# Patient Record
Sex: Female | Born: 1940 | Race: Black or African American | Hispanic: No | Smoking: Former smoker
Health system: Southern US, Community
[De-identification: ages and names within clinical notes are randomized; demographics above are authoritative.]

## PROBLEM LIST (undated history)

## (undated) DIAGNOSIS — I219 Acute myocardial infarction, unspecified: Secondary | ICD-10-CM

## (undated) DIAGNOSIS — G35 Multiple sclerosis: Secondary | ICD-10-CM

## (undated) DIAGNOSIS — E785 Hyperlipidemia, unspecified: Secondary | ICD-10-CM

## (undated) DIAGNOSIS — I509 Heart failure, unspecified: Secondary | ICD-10-CM

## (undated) DIAGNOSIS — E119 Type 2 diabetes mellitus without complications: Secondary | ICD-10-CM

## (undated) DIAGNOSIS — I10 Essential (primary) hypertension: Secondary | ICD-10-CM

## (undated) HISTORY — DX: Acute myocardial infarction, unspecified: I21.9

## (undated) HISTORY — PX: HYSTEROTOMY: SHX1776

## (undated) HISTORY — DX: Hyperlipidemia, unspecified: E78.5

## (undated) HISTORY — PX: HIP SURGERY: SHX245

## (undated) HISTORY — DX: Heart failure, unspecified: I50.9

## (undated) HISTORY — DX: Essential (primary) hypertension: I10

## (undated) HISTORY — DX: Multiple sclerosis: G35

## (undated) HISTORY — DX: Type 2 diabetes mellitus without complications: E11.9

---

## 2019-10-17 ENCOUNTER — Encounter: Payer: Self-pay | Admitting: Neurology

## 2019-11-06 ENCOUNTER — Ambulatory Visit: Payer: Self-pay | Admitting: Cardiology

## 2019-11-21 ENCOUNTER — Ambulatory Visit: Payer: Self-pay | Admitting: Cardiology

## 2019-11-28 ENCOUNTER — Other Ambulatory Visit: Payer: Self-pay

## 2019-11-28 ENCOUNTER — Encounter: Payer: Self-pay | Admitting: Cardiology

## 2019-11-28 ENCOUNTER — Ambulatory Visit: Payer: Medicare Other | Admitting: Cardiology

## 2019-11-28 VITALS — BP 135/73 | HR 87 | Resp 16 | Ht 60.0 in | Wt 95.0 lb

## 2019-11-28 DIAGNOSIS — I2510 Atherosclerotic heart disease of native coronary artery without angina pectoris: Secondary | ICD-10-CM | POA: Insufficient documentation

## 2019-11-28 DIAGNOSIS — I10 Essential (primary) hypertension: Secondary | ICD-10-CM

## 2019-11-28 DIAGNOSIS — Z87898 Personal history of other specified conditions: Secondary | ICD-10-CM

## 2019-11-28 DIAGNOSIS — I25118 Atherosclerotic heart disease of native coronary artery with other forms of angina pectoris: Secondary | ICD-10-CM

## 2019-11-28 NOTE — Progress Notes (Signed)
Patient referred by [name] for coronary artery disease with history of MI.   Subjective:   Joanne Hayden, female    DOB: [birth year], 79 y.o.   MRN: [id number]  Chief Complaint  Patient presents with  . Coronary Artery Disease  . Hypertension  . New Patient (Initial Visit)    HPI   Joanne Hayden is 79 y.o. AA female with hypertension, hyperlipidemia, type 2 diabetes mellitus, CAD, multiple sclerosis, primary open-angle glaucoma.  Patient moved to [place] in 2021 from [place] to be closer to family.  She currently lives with her son, who accompanies her at the visit today. She has history of MI in January 2020 treated with PCI. According to patient she has had an echocardiogram several months ago, but it is unclear if she has had a stress test in the past. She currently reports constant non-radiating mild chest pain, not associated with activity. Denies associated symptoms.   Patient reports occasional episodes of syncope. Her last episode was in April 2021 and occurred while walking. Denies associated chest pain, shortness of breath, dizziness. Reports she does not recall the event well.   Of note patient is currently taking Eliquis, although it is not clear why.  Patient has diagnosis of multiple sclerosis since 1985. She reports chronic generalized weakness and fatigue as a result of her MS. She ambulates using a walker at home, prefers wheelchair when outside.    Past Medical History:  Diagnosis Date  . CHF (congestive heart failure) ([place])   . Diabetes mellitus without complication ([place])   . Heart attack ([place])   . Hyperlipidemia   . Hypertension     Past Surgical History:  Procedure Laterality Date  . HIP SURGERY    . HYSTEROTOMY      Social History   Tobacco Use  Smoking Status Former Smoker  . Packs/day: 0.25  . Years: 0.50  . Pack years: 0.12  . Types: Cigarettes  Smokeless Tobacco Never Used    Social History   Substance and  Sexual Activity  Alcohol Use Yes   Comment: occ    Family History  Problem Relation Age of Onset  . Hypertension Sister     Current Outpatient Medications on File Prior to Visit  Medication Sig Dispense Refill  . aspirin EC 81 MG tablet Take 81 mg by mouth daily. Swallow whole.    Marland Kitchen atorvastatin (LIPITOR) 40 MG tablet Take 40 mg by mouth at bedtime.    . dorzolamide-timolol (COSOPT) 22.3-6.8 MG/ML ophthalmic solution 1 drop 2 (two) times daily.    Marland Kitchen ELIQUIS 2.5 MG TABS tablet Take 2.5 mg by mouth 2 (two) times daily.    Marland Kitchen latanoprost (XALATAN) 0.005 % ophthalmic solution 1 drop at bedtime.    Marland Kitchen lisinopril (ZESTRIL) 10 MG tablet Take 10 mg by mouth daily.     No current facility-administered medications on file prior to visit.    Cardiovascular and other pertinent studies:  EKG 11/28/2019: Sinus rhythm 86 bpm Possible old posterior infarct Occasional PAC    Left atrial enlargement.   Recent labs: 10/15/2019: Glucose 93, BUN/Cr 13/0.65. EGFR 88. Na/K 141/4.8. Rest of the CMP normal H/H 13/39. MCV 91. Platelets 147 Chol 96, TG 32, HDL 51, LDL 36 No TSH or A1C available.   Review of Systems  Cardiovascular: Positive for chest pain and leg swelling (bilateral). Negative for claudication, dyspnea on exertion, orthopnea, palpitations and syncope.  Respiratory: Negative for shortness of breath.   Musculoskeletal: Positive for  muscle weakness (Chronic).  Gastrointestinal: Negative for melena.  Neurological: Positive for weakness (Chronic). Negative for dizziness, light-headedness and numbness.        Vitals:   11/28/19 1120  BP: 135/73  Pulse: 87  Resp: 16  SpO2: 95%    Body mass index is 18.55 kg/m. Filed Weights   11/28/19 1120  Weight: 95 lb (43.1 kg)     Objective:   Physical Exam Constitutional:      Appearance: She is normal weight.     Comments: In wheelchair   Cardiovascular:     Rate and Rhythm: Normal rate and regular rhythm.     Pulses:           Radial pulses are 2+ on the right side and 2+ on the left side.       Dorsalis pedis pulses are 1+ on the right side and 1+ on the left side.       Posterior tibial pulses are 1+ on the right side and 1+ on the left side.     Heart sounds: Murmur heard.  Systolic murmur is present with a grade of 3/6 at the upper right sternal border.      Comments: Trace leg edema bilaterally.  Pulmonary:     Effort: Pulmonary effort is normal.     Breath sounds: Normal breath sounds.  Neurological:     Mental Status: She is alert.     Comments: Gait and balance limitations due to MS          Assessment & Recommendations:   79 y.o. African American female with hypertension, hyperlipidemia, type 2 diabetes mellitus, CAD, multiple sclerosis, primary open-angle glaucoma.  Essential Hypertension:  Patient's blood pressure well controlled in the office today. Will continue 30m lisinopril daily. Continue to follow.   Coronary Artery Disease:   In light of patient's history of coronary artery disease and atypical chest pain, will order echocardiogram and nuclear stress test to evaluate atypical chest pain symptoms and investigate CAD. Continue aspirin, atorvastatin, and lisinopril.   [place] to further delineate patient's history and appropriate management. These records will also hopefully explain why patient is currently on Eliquis. No changes made to medications at today's visit.   History of Syncope:  Low suspicion for cardiac etiology of syncope and as it has not occurred recently will not order monitor at this time. However, if she has recurrent episode with consider monitor to evaluate for underlying arrhythmias which could be contributing.  Will follow up in 4-6 weeks to reevaluate symptoms and review results of testing.     Thank you for referring the [place] [facility] Please feel free to contact with any  questions.   MNigel Mormon MD Pager: [phone]: [phone]

## 2019-12-17 ENCOUNTER — Other Ambulatory Visit: Payer: Self-pay

## 2019-12-17 MED ORDER — LISINOPRIL 10 MG PO TABS
10.0000 mg | ORAL_TABLET | Freq: Every day | ORAL | 1 refills | Status: DC
Start: 2019-12-17 — End: 2021-09-01

## 2019-12-20 ENCOUNTER — Ambulatory Visit: Payer: Medicare Other

## 2019-12-20 ENCOUNTER — Other Ambulatory Visit: Payer: Self-pay

## 2019-12-20 DIAGNOSIS — I25118 Atherosclerotic heart disease of native coronary artery with other forms of angina pectoris: Secondary | ICD-10-CM

## 2019-12-26 ENCOUNTER — Other Ambulatory Visit: Payer: Medicare Other

## 2020-01-04 ENCOUNTER — Ambulatory Visit: Payer: Medicare Other | Admitting: Cardiology

## 2020-01-09 ENCOUNTER — Other Ambulatory Visit: Payer: Self-pay

## 2020-01-09 ENCOUNTER — Ambulatory Visit: Payer: Medicare Other

## 2020-01-09 DIAGNOSIS — I25118 Atherosclerotic heart disease of native coronary artery with other forms of angina pectoris: Secondary | ICD-10-CM

## 2020-01-14 NOTE — Progress Notes (Signed)
NEUROLOGY CONSULTATION NOTE  Joanne Hayden MRN: [id number] DOB: [birth year]  Referring provider: Lorenda Ishihara, MD Primary care provider: Lorenda Ishihara, MD   Reason for consult:  Multiple sclerosis  HISTORY OF PRESENT ILLNESS: Joanne Hayden is a 79 year old right-handed female with CHF, CAD, diabetes mellitus, HTN and HLD who presents for multiple sclerosis.  History is supplemented by referring provider's note.  Patient moved to [place] from [place] this year to live closer to family.  She presents to establish care for multiple sclerosis.    She was diagnosed with multiple sclerosis in 1985.  Around 1980 she had numbness in right toe that traveled up to her right knee lasting 2-3 weeks. She had a recurrent episode about a year later but the numbness traveled up her thigh.  It occurred a third time and she was referred to neurology when she had an MRI of the brain that revealed an MS plaque on the right cerebral hemisphere.  She also had abnormal CSF analysis.  She was started on Avonex for 30 years until 2021.  She stopped because she had an MI in 2020.  While on Avonex, she continued to have episodes of numbness and tingling.  Gradually she developed increased weakness in the legs and difficulty ambulating.     Current DMT:  None Past DMT:  Avonex (took for 30 years, stopped April 2021)  Current medications:  Eliquis, lisinopril, atorvastatin 40mg   Vision:  S/p cataract surgery.  Uses reading glasses. Motor:  Generalized weakness.  Sometimes has spasms/jerking of the left foot if touched. Sensory:  She may have mild numbness and tingling in the lower extremities, mostly left side. Pain:  No  Gait:  Ambulates with walker at home.  Uses a wheelchair outside. Bowel/Bladder:  No issues Fatigue:  Chronic Cognition:  Memory okay Mood:  ok She reports episodes of losing consciousness.  No preceding headache, lightheadedness/dizziness, diaphoresis, chest pain,  shortness of breath, or palpitations.   No warning.  Never witnessed.  She is unconscious for brief period.  The first time it occurred, it was after an Avonex injection.  She started having full body shaking and lowered herself to the floor.  She doesn't remember if she had passed out but she realized she had urinated but no tongue biting.  She had two subsequent episodes not preceded by shaking.  The last episode occurred after her Moderna COVID shot in April 2021.  When she woke up on the floor, she did not have incontinence or tongue biting.  July 2021 LABS:  CBC with WBC 4.6, HGB 13.1, HCT 39.3, PLT 147; CMP with Na 141, K 4.8, Cl 104, CO2 30, glucose 93, BUN 13, Cr 0.65, t bili 0.4, ALP 93, AST 21, ALT 20  PAST MEDICAL HISTORY: Past Medical History:  Diagnosis Date  . CHF (congestive heart failure) ([facility])   . Diabetes mellitus without complication ([facility])   . Heart attack ([facility])   . Hyperlipidemia   . Hypertension     PAST SURGICAL HISTORY: Past Surgical History:  Procedure Laterality Date  . HIP SURGERY    . HYSTEROTOMY      MEDICATIONS: Current Outpatient Medications on File Prior to Visit  Medication Sig Dispense Refill  . aspirin EC 81 MG tablet Take 81 mg by mouth daily. Swallow whole.    August 2021 atorvastatin (LIPITOR) 40 MG tablet Take 40 mg by mouth at bedtime.    . dorzolamide-timolol (COSOPT) 22.3-6.8 MG/ML ophthalmic solution 1 drop 2 (two) times  daily.    Marland Kitchen ELIQUIS 2.5 MG TABS tablet Take 2.5 mg by mouth 2 (two) times daily.    Marland Kitchen latanoprost (XALATAN) 0.005 % ophthalmic solution 1 drop at bedtime.    Marland Kitchen lisinopril (ZESTRIL) 10 MG tablet Take 1 tablet (10 mg total) by mouth daily. 90 tablet 1   No current facility-administered medications on file prior to visit.    ALLERGIES: Not on File  FAMILY HISTORY: Family History  Problem Relation Age of Onset  . Hypertension Sister     SOCIAL HISTORY: Social History   Socioeconomic History  . Marital status: Widowed     Spouse name: Not on file  . Number of children: 2  . Years of education: Not on file  . Highest education level: Not on file  Occupational History  . Not on file  Tobacco Use  . Smoking status: Former Smoker    Packs/day: 0.25    Years: 0.50    Pack years: 0.12    Types: Cigarettes  . Smokeless tobacco: Never Used  Vaping Use  . Vaping Use: Never used  Substance and Sexual Activity  . Alcohol use: Yes    Comment: occ  . Drug use: Never  . Sexual activity: Not on file  Other Topics Concern  . Not on file  Social History Narrative  . Not on file   Social Determinants of Health   Financial Resource Strain:   . Difficulty of Paying Living Expenses: Not on file  Food Insecurity:   . Worried About Programme researcher, broadcasting/film/video in the Last Year: Not on file  . Ran Out of Food in the Last Year: Not on file  Transportation Needs:   . Lack of Transportation (Medical): Not on file  . Lack of Transportation (Non-Medical): Not on file  Physical Activity:   . Days of Exercise per Week: Not on file  . Minutes of Exercise per Session: Not on file  Stress:   . Feeling of Stress : Not on file  Social Connections:   . Frequency of Communication with Friends and Family: Not on file  . Frequency of Social Gatherings with Friends and Family: Not on file  . Attends Religious Services: Not on file  . Active Member of Clubs or Organizations: Not on file  . Attends Banker Meetings: Not on file  . Marital Status: Not on file  Intimate Partner Violence:   . Fear of Current or Ex-Partner: Not on file  . Emotionally Abused: Not on file  . Physically Abused: Not on file  . Sexually Abused: Not on file    PHYSICAL EXAM: Blood pressure (!) 153/81, pulse 64, height 5\' 2"  (1.575 m), SpO2 96 %. General: No acute distress.  Patient appears well-groomed.  Head:  Normocephalic/atraumatic Eyes:  fundi examined but not visualized Neck: supple, no paraspinal tenderness, full range of  motion Back: No paraspinal tenderness Heart: regular rate and rhythm Lungs: Clear to auscultation bilaterally. Vascular: No carotid bruits. Neurological Exam: Mental status: alert and oriented to person, place, and time, recent and remote memory intact, fund of knowledge intact, attention and concentration intact, speech fluent and not dysarthric, language intact. Cranial nerves: CN I: not tested CN II: pupils equal, round and reactive to light, visual fields intact CN III, IV, VI:  full range of motion, no nystagmus, no ptosis CN V: facial sensation intact CN VII: upper and lower face symmetric CN VIII: hearing intact CN IX, X: gag intact, uvula midline CN XI:  sternocleidomastoid and trapezius muscles intact CN XII: tongue midline Bulk & Tone: increased tone in lower greater than upper extremities, no fasciculations. Motor:  5-/5 bilateral upper extremities, 2/5 bilateral hip flexion, 3+/5 right knee extension/flexion, right foot drop, otherwise 5/5. Sensation:  Pinprick sensation intact and vibratory sensation slightly reduced in feet.. Deep Tendon Reflexes:  3+ throughout, toes downgoing.  Finger to nose testing:  Without dysmetria.   Gait:  Deferred.  In wheelchair.  IMPRESSION: 1.  Multiple sclerosis.  I think it is reasonable to remain off of Avonex.  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  Also, her MS may have "burnt out" at this time.  If we should ever need to revisit starting another DMT, there are other options for active SPMS. 2.  Recurrent syncope.  Would like to evaluate for possible seizure. PLAN: 1.  Will check routine EEG to evaluate for epileptiform discharges. 2.  Check vitamin D level 3.  Further recommendations pending results.  Otherwise, follow up in 6 months.  Thank you for allowing me to take part in the care of this patient.  Shon Millet, DO  CC: Lorenda Ishihara, MD

## 2020-01-15 ENCOUNTER — Ambulatory Visit: Payer: Medicare Other | Admitting: Neurology

## 2020-01-15 ENCOUNTER — Other Ambulatory Visit: Payer: Self-pay

## 2020-01-15 ENCOUNTER — Encounter: Payer: Self-pay | Admitting: Neurology

## 2020-01-15 VITALS — BP 153/81 | HR 64 | Ht 62.0 in

## 2020-01-15 DIAGNOSIS — Z87898 Personal history of other specified conditions: Secondary | ICD-10-CM

## 2020-01-15 DIAGNOSIS — G35 Multiple sclerosis: Secondary | ICD-10-CM | POA: Diagnosis not present

## 2020-01-15 NOTE — Patient Instructions (Addendum)
I think it is reasonable to remain off of Avonex.  It is likely not effective anymore.  The MS likely has "burnt out" anyway.    I would like to check a vitamin D level I would also like to check an EEG to record brainwaves Further recommendations pending results. Otherwise, follow up in 6 months.    Your provider has requested that you have labwork completed today. Please go to [facility] ([id number]) on the second floor of this building before leaving the office today. You do not need to check in. If you are not called within 15 minutes please check with the front desk.

## 2020-01-16 NOTE — Progress Notes (Signed)
[employer] Forms filled out.

## 2020-01-18 ENCOUNTER — Telehealth: Payer: Self-pay | Admitting: Neurology

## 2020-01-18 NOTE — Telephone Encounter (Signed)
Patient let message with acessnurse requesting to speak with Dr Moises Blood assistant regarding paper work. Please call

## 2020-01-18 NOTE — Telephone Encounter (Signed)
LMOVM, Letter ready for pt to pick up. At the front desk

## 2020-01-22 NOTE — Progress Notes (Signed)
Patient referred by [name] for coronary artery disease with history of MI.   Subjective:   Joanne Hayden, female    DOB: 05/10/1940, 79 y.o.   MRN: [id number]   Chief Complaint  Patient presents with  . Coronary Artery Disease  . Follow-up    HPI   Joanne Hayden is 79 y.o. AA female with hypertension, hyperlipidemia, type 2 diabetes mellitus, CAD, multiple sclerosis, primary open-angle glaucoma.  Patient has not had any new symptoms since last visit. Echocardiogram results reviewed with the patient, details below.   Patient does not have h/o smoking, h/o lung disease. She does have limited physical mobility due to to her MS. She has noted swelling in both her legs, R>L. She has had one episode of syncope, which was previously attributed to low blood glucose.   Initial consultation HPI 11/28/2019: Patient moved to [facility] in 2021 from [place] to be closer to family.  She currently lives with her son, who accompanies her at the visit today. She has history of MI in January 2020 treated with PCI. According to patient she has had an echocardiogram several months ago, but it is unclear if she has had a stress test in the past. She currently reports constant non-radiating mild chest pain, not associated with activity. Denies associated symptoms.   Patient reports occasional episodes of syncope. Her last episode was in April 2021 and occurred while walking. Denies associated chest pain, shortness of breath, dizziness. Reports she does not recall the event well.   Of note patient is currently taking Eliquis, although it is not clear why.  Patient has diagnosis of multiple sclerosis since 1985. She reports chronic generalized weakness and fatigue as a result of her MS. She ambulates using a walker at home, prefers wheelchair when outside.     Current Outpatient Medications on File Prior to Visit  Medication Sig Dispense Refill  . acetaminophen (TYLENOL) 325 MG  tablet Take 650 mg by mouth every 6 (six) hours as needed.    Marland Kitchen aspirin EC 81 MG tablet Take 81 mg by mouth daily. Swallow whole.    Marland Kitchen atorvastatin (LIPITOR) 40 MG tablet Take 40 mg by mouth at bedtime.    . dorzolamide-timolol (COSOPT) 22.3-6.8 MG/ML ophthalmic solution 1 drop 2 (two) times daily.    Marland Kitchen ELIQUIS 2.5 MG TABS tablet Take 2.5 mg by mouth 2 (two) times daily.    Marland Kitchen latanoprost (XALATAN) 0.005 % ophthalmic solution 1 drop at bedtime.    Marland Kitchen lisinopril (ZESTRIL) 10 MG tablet Take 1 tablet (10 mg total) by mouth daily. 90 tablet 1   No current facility-administered medications on file prior to visit.    Cardiovascular and other pertinent studies:  Lexiscan Tetrofosmin Stress Test  01/09/2020: Nondiagnostic ECG stress. Resting EKG/ECG demonstrated normal sinus rhythm. Observed anteroseptal infarct. No ST-T wave abnormalities.  Peak EKG/ECG revealed no ST-T wave abnormalities. Rare PACs and PVCs.  Myocardial perfusion is normal. Although the TID ratio 1.47 is abnormal, visually I do not agree with the findings and LV appears to be normal/small sized in both rest and stress images.  LV end-diastolic volume was 50 mL. Overall LV systolic function is normal without regional wall motion abnormalities. Stress LV EF: 61%.  No previous exam available for comparison. Low risk.   Echocardiogram 12/20/2019:  Left ventricle cavity is normal in size. Moderate concentric hypertrophy  of the left ventricle. Normal global wall motion. Normal LV systolic  function with EF 55%. Doppler evidence of  grade I (impaired) diastolic  dysfunction, normal LAP.  Left atrial cavity is slightly dilated. Aneurysmal interatrial septum  without 2D or color Doppler evidence of interatrial shunt.  Right atrial cavity is moderately dilated.  Trileaflet aortic valve. Moderate (Grade II) aortic regurgitation.  Moderate (Grade II) mitral regurgitation.  Severe tricuspid regurgitation. Estimated pulmonary artery systolic    pressure 25 mmHg.  EKG 11/28/2019: Sinus rhythm 86 bpm Possible old posterior infarct Occasional PAC    Left atrial enlargement.   Recent labs: 10/15/2019: Glucose 93, BUN/Cr 13/0.65. EGFR 88. Na/K 141/4.8. Rest of the CMP normal H/H 13/39. MCV 91. Platelets 147 Chol 96, TG 32, HDL 51, LDL 36 No TSH or A1C available.   Review of Systems  Cardiovascular: Positive for chest pain and leg swelling (bilateral). Negative for claudication, dyspnea on exertion, orthopnea, palpitations and syncope.  Respiratory: Negative for shortness of breath.   Musculoskeletal: Positive for muscle weakness (Chronic).  Gastrointestinal: Negative for melena.  Neurological: Positive for weakness (Chronic). Negative for dizziness, light-headedness and numbness.        Vitals:   01/23/20 1348  BP: (!) 149/77  Pulse: 83  Resp: 16  SpO2: 94%     Body mass index is 17.3 kg/m. Filed Weights   01/23/20 1348  Weight: 94 lb 9.6 oz (42.9 kg)     Objective:   Physical Exam Constitutional:      Appearance: She is normal weight.     Comments: In wheelchair   Cardiovascular:     Rate and Rhythm: Normal rate and regular rhythm.     Pulses:          Radial pulses are 2+ on the right side and 2+ on the left side.       Dorsalis pedis pulses are 1+ on the right side and 1+ on the left side.       Posterior tibial pulses are 1+ on the right side and 1+ on the left side.     Heart sounds: Murmur heard.  Systolic murmur is present with a grade of 3/6 at the upper right sternal border.      Comments: Trace leg edema bilaterally.  Pulmonary:     Effort: Pulmonary effort is normal.     Breath sounds: Normal breath sounds.  Neurological:     Mental Status: She is alert.     Comments: Gait and balance limitations due to MS          Assessment & Recommendations:   Lorrene Graef is 79 y.o. AA female with hypertension, hyperlipidemia, type 2 diabetes mellitus, CAD, multiple sclerosis, primary  open-angle glaucoma, mod-severe tricuspid regurgitation, h/o syncope.  Tricuspid regurgitation: Given her limited mobility due to MS< leg swelling, h/o syncope, and mod=seere tricuspid regurgitation; DVT/PE is in the differential. Recommend LE venous duplex, chest Xray, VQ scan.  Essential Hypertension:  Patient's blood pressure well controlled in the office today. Will continue 76m lisinopril daily. Continue to follow.   Coronary Artery Disease:   No angina. No ischemia on stress test. Continue aspirin, atorvastatin, and lisinopril.    F/u after above tests   MNigel Mormon MD Pager: [phone]: [phone]

## 2020-01-23 ENCOUNTER — Other Ambulatory Visit: Payer: Self-pay

## 2020-01-23 ENCOUNTER — Ambulatory Visit: Payer: Medicare Other | Admitting: Cardiology

## 2020-01-23 ENCOUNTER — Encounter: Payer: Self-pay | Admitting: Cardiology

## 2020-01-23 VITALS — BP 149/77 | HR 83 | Resp 16 | Ht 62.0 in | Wt 94.6 lb

## 2020-01-23 DIAGNOSIS — I361 Nonrheumatic tricuspid (valve) insufficiency: Secondary | ICD-10-CM

## 2020-01-23 DIAGNOSIS — I25118 Atherosclerotic heart disease of native coronary artery with other forms of angina pectoris: Secondary | ICD-10-CM

## 2020-01-23 DIAGNOSIS — I10 Essential (primary) hypertension: Secondary | ICD-10-CM

## 2020-01-23 DIAGNOSIS — Z87898 Personal history of other specified conditions: Secondary | ICD-10-CM

## 2020-01-23 DIAGNOSIS — I2510 Atherosclerotic heart disease of native coronary artery without angina pectoris: Secondary | ICD-10-CM

## 2020-01-23 DIAGNOSIS — M7989 Other specified soft tissue disorders: Secondary | ICD-10-CM

## 2020-01-23 NOTE — Patient Instructions (Signed)
Echocardiogram showed that your tricuspid (right-sided) valve is moderate to severely leaky.  Right side of your heart is mildly dilated.  Given your limited physical activity, you are at increased risk of clot formation in your leg veins, that could travel to your lung arteries and cause pulmonary embolism.  This could potentially be tied to an episode of loss of consciousness or syncope, as well as leakiness of your tricuspid valve and increase in size of the right-sided heart chambers.  I recommend doing the following tests:  Ultrasound of your leg veins to rule out lung vein clot or deep vein thrombosis. Chest x-ray VQ scan to rule out lung artery clot or pulmonary embolism

## 2020-01-24 ENCOUNTER — Ambulatory Visit (INDEPENDENT_AMBULATORY_CARE_PROVIDER_SITE_OTHER): Payer: Medicare Other | Admitting: Neurology

## 2020-01-24 ENCOUNTER — Other Ambulatory Visit (INDEPENDENT_AMBULATORY_CARE_PROVIDER_SITE_OTHER): Payer: Medicare Other

## 2020-01-24 DIAGNOSIS — Z87898 Personal history of other specified conditions: Secondary | ICD-10-CM

## 2020-01-24 DIAGNOSIS — G35 Multiple sclerosis: Secondary | ICD-10-CM

## 2020-01-24 LAB — VITAMIN D 25 HYDROXY (VIT D DEFICIENCY, FRACTURES): VITD: 25.72 ng/mL — ABNORMAL LOW (ref 30.00–100.00)

## 2020-01-25 ENCOUNTER — Telehealth: Payer: Self-pay | Admitting: Neurology

## 2020-01-25 NOTE — Telephone Encounter (Signed)
-----   Message from Drema Dallas, DO sent at 01/25/2020  7:44 AM EDT ----- Vit d low.  Recommend starting OTC D3 4000 IU daily.  Repeat a week prior to follow up

## 2020-01-25 NOTE — Telephone Encounter (Signed)
Patient called in stating she was just on the phone with someone, but she could not hear them. Possible lab results.

## 2020-01-25 NOTE — Procedures (Signed)
ELECTROENCEPHALOGRAM REPORT  Date of Study: 01/24/2020  Patient's Name: Joanne Hayden MRN: [id number] Date of Birth: May 08, 1940  Referring Provider:  Lorenda Ishihara, MD  Clinical History: 79 year old female with multiple sclerosis presents with recurrent syncope  Medications: Aspirin Atorvastatin Eliquis Lisinopril  Technical Summary: A multichannel digital EEG recording measured by the international 10-20 system with electrodes applied with paste and impedances below 5000 ohms performed in our laboratory with EKG monitoring in an awake and drowsy patient.  Hyperventilation was not performed as patient wearing a face mask due to the Covid-19 pandemic.  Photic stimulation was performed.  The digital EEG was referentially recorded, reformatted, and digitally filtered in a variety of bipolar and referential montages for optimal display.    Description: The patient is awake and drowsy during the recording.  During maximal wakefulness, there is a symmetric, medium voltage 9 Hz posterior dominant rhythm that attenuates with eye opening.  The record is symmetric.  Stage 2 sleep was not seen.  Photic stimulation did not elicit any abnormalities.  There were no epileptiform discharges or electrographic seizures seen.    EKG lead was unremarkable.  Impression: This awake and drowsy EEG is normal.    Clinical Correlation: A normal EEG does not exclude a clinical diagnosis of epilepsy.  If further clinical questions remain, prolonged EEG may be helpful.  Clinical correlation is advised.   Shon Millet, DO

## 2020-01-25 NOTE — Telephone Encounter (Signed)
Called patient and left message for a call back.  

## 2020-02-12 ENCOUNTER — Ambulatory Visit (HOSPITAL_COMMUNITY)
Admission: RE | Admit: 2020-02-12 | Discharge: 2020-02-12 | Disposition: A | Discharge status: Home / Self Care | Payer: Medicare Other | Source: Ambulatory Visit | Attending: Cardiology | Admitting: Cardiology

## 2020-02-12 ENCOUNTER — Other Ambulatory Visit: Payer: Self-pay | Admitting: Cardiology

## 2020-02-12 ENCOUNTER — Other Ambulatory Visit: Payer: Self-pay

## 2020-02-12 DIAGNOSIS — Z87898 Personal history of other specified conditions: Secondary | ICD-10-CM | POA: Insufficient documentation

## 2020-02-12 DIAGNOSIS — I361 Nonrheumatic tricuspid (valve) insufficiency: Secondary | ICD-10-CM

## 2020-02-12 MED ORDER — TECHNETIUM TO 99M ALBUMIN AGGREGATED
4.2000 | Freq: Once | INTRAVENOUS | Status: AC | PRN
  Administered 2020-02-12: 4.2 via INTRAVENOUS

## 2020-02-15 ENCOUNTER — Other Ambulatory Visit: Payer: Self-pay

## 2020-02-15 ENCOUNTER — Ambulatory Visit: Payer: Medicare Other

## 2020-02-15 DIAGNOSIS — M7989 Other specified soft tissue disorders: Secondary | ICD-10-CM

## 2020-02-21 ENCOUNTER — Encounter: Payer: Self-pay | Admitting: Podiatry

## 2020-02-21 ENCOUNTER — Ambulatory Visit: Payer: Medicare Other | Admitting: Podiatry

## 2020-02-21 ENCOUNTER — Other Ambulatory Visit: Payer: Self-pay

## 2020-02-21 DIAGNOSIS — M79675 Pain in left toe(s): Secondary | ICD-10-CM | POA: Diagnosis not present

## 2020-02-21 DIAGNOSIS — L84 Corns and callosities: Secondary | ICD-10-CM

## 2020-02-21 DIAGNOSIS — B351 Tinea unguium: Secondary | ICD-10-CM

## 2020-02-21 DIAGNOSIS — M79674 Pain in right toe(s): Secondary | ICD-10-CM | POA: Diagnosis not present

## 2020-02-21 DIAGNOSIS — M2041 Other hammer toe(s) (acquired), right foot: Secondary | ICD-10-CM

## 2020-02-21 DIAGNOSIS — D689 Coagulation defect, unspecified: Secondary | ICD-10-CM | POA: Diagnosis not present

## 2020-02-21 DIAGNOSIS — M2042 Other hammer toe(s) (acquired), left foot: Secondary | ICD-10-CM

## 2020-02-21 NOTE — Progress Notes (Signed)
Subjective:   Patient ID: Joanne Hayden, female   DOB: 78 y.o.   MRN: [id number]   HPI Patient presents in poor health with nail disease about both feet that is very painful and lesions bilateral on the third and fifth digits that are painful when pressed.  Patient states this is been ongoing and giving her problems and she cannot take care of this herself.  Also has digital deformities structural bunion deformity patient does not smoke and likes to be active if possible   Review of Systems  All other systems reviewed and are negative.       Objective:  Physical Exam Vitals and nursing note reviewed.  Constitutional:      Appearance: She is well-developed.  Pulmonary:     Effort: Pulmonary effort is normal.  Musculoskeletal:        General: Normal range of motion.  Skin:    General: Skin is warm.  Neurological:     Mental Status: She is alert.     Neurovascular status was found to be intact with mild diminishment of sharp dull vibratory.  Patient is found to have severe nail disease with thickness 1-5 both feet that are painful and was noted to have lesions bilateral that are painful when pressed with patient on blood thinner at the current time.  Patient did have good digital perfusion after     Assessment:  High risk patient with lesion formation bilateral painful along with nail disease with mycotic component painful bilateral     Plan:  H&P reviewed conditions discussed treatment options.  I do not recommend aggressive treatment and today I debrided nailbeds 1-5 both feet and lesions on both feet with no iatrogenic bleeding and patient will be seen back as needed for conservative treatment

## 2020-02-27 ENCOUNTER — Ambulatory Visit: Payer: Medicare Other | Admitting: Cardiology

## 2020-02-27 ENCOUNTER — Encounter: Payer: Self-pay | Admitting: Cardiology

## 2020-02-27 ENCOUNTER — Other Ambulatory Visit: Payer: Self-pay

## 2020-02-27 VITALS — BP 125/63 | HR 81 | Resp 16 | Ht 62.0 in | Wt 95.0 lb

## 2020-02-27 DIAGNOSIS — I361 Nonrheumatic tricuspid (valve) insufficiency: Secondary | ICD-10-CM

## 2020-02-27 DIAGNOSIS — I10 Essential (primary) hypertension: Secondary | ICD-10-CM

## 2020-02-27 DIAGNOSIS — I2510 Atherosclerotic heart disease of native coronary artery without angina pectoris: Secondary | ICD-10-CM

## 2020-02-27 NOTE — Progress Notes (Signed)
Patient referred by Joanne Hayden for coronary artery disease with history of MI.   Subjective:   Joanne Hayden, female    DOB: 27-May-1940, 79 y.o.   MRN: [id number]   Chief Complaint  Patient presents with  . Hypertension  . Follow-up    HPI   Joanne Hayden is 79 y.o. AA female with hypertension, hyperlipidemia, type 2 diabetes mellitus, CAD, multiple sclerosis, primary open-angle glaucoma.  Patient has not had any new symptoms since last visit. Echocardiogram, LE venous duplex and VQ scan results reviewed with the patient, details below.   Patient is here today with her daughter.  She is doing well, and does not have any chest pain, shortness of breath.  She has neck pain with radiation to both her arms, attributed to her arthritis.  Initial consultation HPI 11/28/2019: Patient moved to [facility] in 2021 from [place] to be closer to family.  She currently lives with her son, who accompanies her at the visit today. She has history of MI in January 2020 treated with PCI. According to patient she has had an echocardiogram several months ago, but it is unclear if she has had a stress test in the past. She currently reports constant non-radiating mild chest pain, not associated with activity. Denies associated symptoms.   Patient reports occasional episodes of syncope. Her last episode was in April 2021 and occurred while walking. Denies associated chest pain, shortness of breath, dizziness. Reports she does not recall the event well.   Of note patient is currently taking Eliquis, although it is not clear why.  Patient has diagnosis of multiple sclerosis since 1985. She reports chronic generalized weakness and fatigue as a result of her MS. She ambulates using a walker at home, prefers wheelchair when outside.     Current Outpatient Medications on File Prior to Visit  Medication Sig Dispense Refill  . acetaminophen (TYLENOL) 325 MG tablet Take 650 mg by mouth every 6  (six) hours as needed.    Marland Kitchen aspirin EC 81 MG tablet Take 81 mg by mouth daily. Swallow whole.    Marland Kitchen atorvastatin (LIPITOR) 40 MG tablet Take 40 mg by mouth at bedtime.    . dorzolamide-timolol (COSOPT) 22.3-6.8 MG/ML ophthalmic solution 1 drop 2 (two) times daily.    Marland Kitchen ELIQUIS 2.5 MG TABS tablet Take 2.5 mg by mouth 2 (two) times daily.    Marland Kitchen latanoprost (XALATAN) 0.005 % ophthalmic solution 1 drop at bedtime.    Marland Kitchen lisinopril (ZESTRIL) 10 MG tablet Take 1 tablet (10 mg total) by mouth daily. 90 tablet 1   No current facility-administered medications on file prior to visit.    Cardiovascular and other pertinent studies:  Lower Extremity Venous Duplex (bilateral) 02/15/2020:  No evidence of deep vein thrombosis of the lower extremities with normal  venous return.  VQ scan 02/12/2020: No evidence acute pulmonary embolism.  Lexiscan Tetrofosmin Stress Test  01/09/2020: Nondiagnostic ECG stress. Resting EKG/ECG demonstrated normal sinus rhythm. Observed anteroseptal infarct. No ST-T wave abnormalities.  Peak EKG/ECG revealed no ST-T wave abnormalities. Rare PACs and PVCs.  Myocardial perfusion is normal. Although the TID ratio 1.47 is abnormal, visually I do not agree with the findings and LV appears to be normal/small sized in both rest and stress images.  LV end-diastolic volume was 50 mL. Overall LV systolic function is normal without regional wall motion abnormalities. Stress LV EF: 61%.  No previous exam available for comparison. Low risk.   Echocardiogram 12/20/2019:  Left ventricle  cavity is normal in size. Moderate concentric hypertrophy  of the left ventricle. Normal global wall motion. Normal LV systolic  function with EF 55%. Doppler evidence of grade I (impaired) diastolic  dysfunction, normal LAP.  Left atrial cavity is slightly dilated. Aneurysmal interatrial septum  without 2D or color Doppler evidence of interatrial shunt.  Right atrial cavity is moderately dilated.   Trileaflet aortic valve. Moderate (Grade II) aortic regurgitation.  Moderate (Grade II) mitral regurgitation.  Severe tricuspid regurgitation. Estimated pulmonary artery systolic  pressure 25 mmHg.  EKG 11/28/2019: Sinus rhythm 86 bpm Possible old posterior infarct Occasional PAC    Left atrial enlargement.   Recent labs: 10/15/2019: Glucose 93, BUN/Cr 13/0.65. EGFR 88. Na/K 141/4.8. Rest of the CMP normal H/H 13/39. MCV 91. Platelets 147 Chol 96, TG 32, HDL 51, LDL 36 No TSH or A1C available.   Review of Systems  Cardiovascular: Positive for chest pain and leg swelling (bilateral). Negative for claudication, dyspnea on exertion, orthopnea, palpitations and syncope.  Respiratory: Negative for shortness of breath.   Musculoskeletal: Positive for muscle weakness (Chronic).  Gastrointestinal: Negative for melena.  Neurological: Positive for weakness (Chronic). Negative for dizziness, light-headedness and numbness.        Vitals:   02/27/20 1138 02/27/20 1158  BP: (!) 172/83 125/63  Pulse: 94 81  Resp: 16   SpO2: 98%      Body mass index is 17.38 kg/m. Filed Weights   02/27/20 1138  Weight: 95 lb (43.1 kg)     Objective:   Physical Exam Constitutional:      Appearance: She is normal weight.     Comments: In wheelchair   Cardiovascular:     Rate and Rhythm: Normal rate and regular rhythm.     Pulses:          Radial pulses are 2+ on the right side and 2+ on the left side.       Dorsalis pedis pulses are 1+ on the right side and 1+ on the left side.       Posterior tibial pulses are 1+ on the right side and 1+ on the left side.     Heart sounds: Murmur heard.  Systolic murmur is present with a grade of 3/6 at the upper right sternal border.   Pulmonary:     Effort: Pulmonary effort is normal.     Breath sounds: Normal breath sounds.  Musculoskeletal:     Right lower leg: No edema.     Left lower leg: No edema.  Neurological:     Mental Status: She is  alert.     Comments: Gait and balance limitations due to MS          Assessment & Recommendations:   Joanne Hayden is 79 y.o. AA female with hypertension, hyperlipidemia, type 2 diabetes mellitus, CAD, multiple sclerosis, primary open-angle glaucoma, mod-severe tricuspid regurgitation, h/o syncope.  Tricuspid regurgitation: Given her limited mobility due to MS< leg swelling, h/o syncope, and mod-severe tricuspid regurgitation, I obtained LE venous duplex, chest Xray, VQ scan, all of which were normal.  She is asymptomatic from tricuspid regurgitation and does not need any intervention at this time.  Essential Hypertension:  Fairly well controlled.  Coronary Artery Disease:   No angina. No ischemia on stress test. Continue aspirin, atorvastatin, and lisinopril.   It remains unclear to me why patient is on Eliquis.  She was started on this in [place].  There is no definitive diagnosis of A. fib, DVT,  PE that I am aware of.  I will obtain more records from [place].  F/u in 6 months    Nigel Mormon, MD Pager: [phone] Office: [phone]

## 2020-04-17 ENCOUNTER — Telehealth: Payer: Self-pay | Admitting: Neurology

## 2020-04-17 NOTE — Telephone Encounter (Signed)
Called and lmom to have patient call back so we could make appt for her

## 2020-04-17 NOTE — Telephone Encounter (Signed)
Patient called in stating Dr. Everlena Cooper told her to see her PCP for her shoulder problem. She did see her PCP and her PCP felt this was a neurologist issue. She is not sure what to do at this point. She cannot lift her shoulder to put eye drops in due to the pain. She would like some advice.

## 2020-04-17 NOTE — Telephone Encounter (Signed)
She needs to make an appointment so that I can evaluate

## 2020-04-17 NOTE — Telephone Encounter (Signed)
Telephone call to pt, Pt states she was never seen by her pcp, she was advise that the provider stated that she needs to see Neurologist.  The pain is in the right arm radiating from the neck down the arm and into the Wrist.   Pt want to know what she should do?   Please advise  Pt advised she needs to be seen.   Front desk please call pt to schedule a visit.

## 2020-04-21 DIAGNOSIS — E1169 Type 2 diabetes mellitus with other specified complication: Secondary | ICD-10-CM | POA: Diagnosis not present

## 2020-04-21 DIAGNOSIS — I2510 Atherosclerotic heart disease of native coronary artery without angina pectoris: Secondary | ICD-10-CM | POA: Diagnosis not present

## 2020-04-21 DIAGNOSIS — I10 Essential (primary) hypertension: Secondary | ICD-10-CM | POA: Diagnosis not present

## 2020-04-21 DIAGNOSIS — E785 Hyperlipidemia, unspecified: Secondary | ICD-10-CM | POA: Diagnosis not present

## 2020-06-02 DIAGNOSIS — E785 Hyperlipidemia, unspecified: Secondary | ICD-10-CM | POA: Diagnosis not present

## 2020-06-02 DIAGNOSIS — I10 Essential (primary) hypertension: Secondary | ICD-10-CM | POA: Diagnosis not present

## 2020-06-02 DIAGNOSIS — I2510 Atherosclerotic heart disease of native coronary artery without angina pectoris: Secondary | ICD-10-CM | POA: Diagnosis not present

## 2020-06-02 DIAGNOSIS — E1169 Type 2 diabetes mellitus with other specified complication: Secondary | ICD-10-CM | POA: Diagnosis not present

## 2020-06-03 ENCOUNTER — Encounter: Payer: Self-pay | Admitting: Podiatry

## 2020-06-03 ENCOUNTER — Other Ambulatory Visit: Payer: Self-pay

## 2020-06-03 ENCOUNTER — Ambulatory Visit (INDEPENDENT_AMBULATORY_CARE_PROVIDER_SITE_OTHER): Payer: Medicare Other | Admitting: Podiatry

## 2020-06-03 DIAGNOSIS — L84 Corns and callosities: Secondary | ICD-10-CM

## 2020-06-03 DIAGNOSIS — M79675 Pain in left toe(s): Secondary | ICD-10-CM | POA: Diagnosis not present

## 2020-06-03 DIAGNOSIS — M2042 Other hammer toe(s) (acquired), left foot: Secondary | ICD-10-CM

## 2020-06-03 DIAGNOSIS — R269 Unspecified abnormalities of gait and mobility: Secondary | ICD-10-CM | POA: Insufficient documentation

## 2020-06-03 DIAGNOSIS — E1169 Type 2 diabetes mellitus with other specified complication: Secondary | ICD-10-CM | POA: Diagnosis not present

## 2020-06-03 DIAGNOSIS — E785 Hyperlipidemia, unspecified: Secondary | ICD-10-CM | POA: Insufficient documentation

## 2020-06-03 DIAGNOSIS — M2041 Other hammer toe(s) (acquired), right foot: Secondary | ICD-10-CM | POA: Diagnosis not present

## 2020-06-03 DIAGNOSIS — B351 Tinea unguium: Secondary | ICD-10-CM

## 2020-06-03 DIAGNOSIS — G35 Multiple sclerosis: Secondary | ICD-10-CM | POA: Insufficient documentation

## 2020-06-03 DIAGNOSIS — E440 Moderate protein-calorie malnutrition: Secondary | ICD-10-CM | POA: Insufficient documentation

## 2020-06-03 DIAGNOSIS — E119 Type 2 diabetes mellitus without complications: Secondary | ICD-10-CM

## 2020-06-03 DIAGNOSIS — M79674 Pain in right toe(s): Secondary | ICD-10-CM | POA: Diagnosis not present

## 2020-06-03 DIAGNOSIS — G35D Multiple sclerosis, unspecified: Secondary | ICD-10-CM

## 2020-06-03 DIAGNOSIS — H401134 Primary open-angle glaucoma, bilateral, indeterminate stage: Secondary | ICD-10-CM | POA: Insufficient documentation

## 2020-06-03 NOTE — Patient Instructions (Signed)
Purchase Skechers with stretchable uppers at [employer].   Diabetes Mellitus and Foot Care Foot care is an important part of your health, especially when you have diabetes. Diabetes may cause you to have problems because of poor blood flow (circulation) to your feet and legs, which can cause your skin to:  Become thinner and drier.  Break more easily.  Heal more slowly.  Peel and crack. You may also have nerve damage (neuropathy) in your legs and feet, causing decreased feeling in them. This means that you may not notice minor injuries to your feet that could lead to more serious problems. Noticing and addressing any potential problems early is the best way to prevent future foot problems. How to care for your feet Foot hygiene  Wash your feet daily with warm water and mild soap. Do not use hot water. Then, pat your feet and the areas between your toes until they are completely dry. Do not soak your feet as this can dry your skin.  Trim your toenails straight across. Do not dig under them or around the cuticle. File the edges of your nails with an emery board or nail file.  Apply a moisturizing lotion or petroleum jelly to the skin on your feet and to dry, brittle toenails. Use lotion that does not contain alcohol and is unscented. Do not apply lotion between your toes.   Shoes and socks  Wear clean socks or stockings every day. Make sure they are not too tight. Do not wear knee-high stockings since they may decrease blood flow to your legs.  Wear shoes that fit properly and have enough cushioning. Always look in your shoes before you put them on to be sure there are no objects inside.  To break in new shoes, wear them for just a few hours a day. This prevents injuries on your feet. Wounds, scrapes, corns, and calluses  Check your feet daily for blisters, cuts, bruises, sores, and redness. If you cannot see the bottom of your feet, use a mirror or ask someone for help.  Do not cut  corns or calluses or try to remove them with medicine.  If you find a minor scrape, cut, or break in the skin on your feet, keep it and the skin around it clean and dry. You may clean these areas with mild soap and water. Do not clean the area with peroxide, alcohol, or iodine.  If you have a wound, scrape, corn, or callus on your foot, look at it several times a day to make sure it is healing and not infected. Check for: ? Redness, swelling, or pain. ? Fluid or blood. ? Warmth. ? Pus or a bad smell.   General tips  Do not cross your legs. This may decrease blood flow to your feet.  Do not use heating pads or hot water bottles on your feet. They may burn your skin. If you have lost feeling in your feet or legs, you may not know this is happening until it is too late.  Protect your feet from hot and cold by wearing shoes, such as at the beach or on hot pavement.  Schedule a complete foot exam at least once a year (annually) or more often if you have foot problems. Report any cuts, sores, or bruises to your health care provider immediately. Where to find more information  American Diabetes Association: www.diabetes.org  Association of Diabetes Care & Education Specialists: www.diabeteseducator.org Contact a health care provider if:  You have a  medical condition that increases your risk of infection and you have any cuts, sores, or bruises on your feet.  You have an injury that is not healing.  You have redness on your legs or feet.  You feel burning or tingling in your legs or feet.  You have pain or cramps in your legs and feet.  Your legs or feet are numb.  Your feet always feel cold.  You have pain around any toenails. Get help right away if:  You have a wound, scrape, corn, or callus on your foot and: ? You have pain, swelling, or redness that gets worse. ? You have fluid or blood coming from the wound, scrape, corn, or callus. ? Your wound, scrape, corn, or callus  feels warm to the touch. ? You have pus or a bad smell coming from the wound, scrape, corn, or callus. ? You have a fever. ? You have a red line going up your leg. Summary  Check your feet every day for blisters, cuts, bruises, sores, and redness.  Apply a moisturizing lotion or petroleum jelly to the skin on your feet and to dry, brittle toenails.  Wear shoes that fit properly and have enough cushioning.  If you have foot problems, report any cuts, sores, or bruises to your health care provider immediately.  Schedule a complete foot exam at least once a year (annually) or more often if you have foot problems. This information is not intended to replace advice given to you by your health care provider. Make sure you discuss any questions you have with your health care provider. Document Revised: 10/11/2019 Document Reviewed: 10/11/2019 Elsevier Patient Education  2021 [employer].

## 2020-06-08 NOTE — Progress Notes (Signed)
ANNUAL DIABETIC FOOT EXAM  Subjective: Kymorah Korf presents today for for annual diabetic foot examination and painful thick toenails that are difficult to trim. Pain interferes with ambulation. Aggravating factors include wearing enclosed shoe gear. Pain is relieved with periodic professional debridement..  Patient relates >20 year h/o diabetes. Patient also has multiple sclerosis managed by Dr. Everlena Cooper in Neurology.  Patient no h/o foot wounds.  Patient does admit symptoms of foot numbness.  Patient does admit symptoms of foot tingling.  Patient does admit symptoms of burning in feet.  Patient does admit symptoms of pins/needles in feet.  Patient does not monitor blood glucose.  Lorenda Ishihara is patient's PCP.  Past Medical History:  Diagnosis Date  . CHF (congestive heart failure) (HCC)   . Diabetes mellitus without complication (HCC)   . Heart attack (HCC)   . Hyperlipidemia   . Hypertension   . Multiple sclerosis [facility])    Patient Active Problem List   Diagnosis Date Noted  . Abnormal gait 06/03/2020  . Dyslipidemia 06/03/2020  . Malnutrition of moderate degree Lily Kocher: 60% to less than 75% of standard weight) (HCC) 06/03/2020  . Multiple sclerosis (HCC) 06/03/2020  . Primary open angle glaucoma (POAG) of both eyes, indeterminate stage 06/03/2020  . Type 2 diabetes mellitus with other specified complication (HCC) 06/03/2020  . Nonrheumatic tricuspid valve regurgitation 02/27/2020  . Essential hypertension 11/28/2019  . Coronary artery disease involving native coronary artery of native heart without angina pectoris 11/28/2019  . History of syncope 11/28/2019   Past Surgical History:  Procedure Laterality Date  . HIP SURGERY    . HYSTEROTOMY     Current Outpatient Medications on File Prior to Visit  Medication Sig Dispense Refill  . acetaminophen (TYLENOL) 325 MG tablet Take 650 mg by mouth every 6 (six) hours as needed.    Marland Kitchen aspirin EC 81 MG tablet Take  81 mg by mouth daily. Swallow whole.    Marland Kitchen atorvastatin (LIPITOR) 40 MG tablet Take 40 mg by mouth at bedtime.    . dorzolamide-timolol (COSOPT) 22.3-6.8 MG/ML ophthalmic solution 1 drop 2 (two) times daily.    Marland Kitchen ELIQUIS 2.5 MG TABS tablet Take 2.5 mg by mouth 2 (two) times daily.    Marland Kitchen latanoprost (XALATAN) 0.005 % ophthalmic solution 1 drop at bedtime.    Marland Kitchen lisinopril (ZESTRIL) 10 MG tablet Take 1 tablet (10 mg total) by mouth daily. 90 tablet 1   No current facility-administered medications on file prior to visit.    Allergies  Allergen Reactions  . Codeine     Other reaction(s): Unknown  . Narcan [Naloxone]     Other reaction(s): Unknown  . Nitrofurantoin     Other reaction(s): Unknown  . Sulfa Antibiotics     Other reaction(s): Unknown   Social History   Occupational History  . Not on file  Tobacco Use  . Smoking status: Former Smoker    Packs/day: 0.25    Years: 0.50    Pack years: 0.12    Types: Cigarettes  . Smokeless tobacco: Never Used  Vaping Use  . Vaping Use: Never used  Substance and Sexual Activity  . Alcohol use: Yes    Comment: occ  . Drug use: Never  . Sexual activity: Not Currently   Family History  Problem Relation Age of Onset  . Hypertension Sister     There is no immunization history on file for this patient.   Review of Systems: Negative except as noted in the HPI.  Objective: There were no vitals filed for this visit.  Vanice Rappa is a pleasant 80 y.o. African American female in NAD. AAO X 3.  Vascular Examination: Capillary fill time to digits <3 seconds b/l lower extremities. Faintly palpable DP pulse(s) b/l lower extremities. Faintly palpable PT pulse(s) b/l lower extremities. Pedal hair absent. Lower extremity skin temperature gradient within normal limits.  Dermatological Examination: Pedal skin with normal turgor, texture and tone bilaterally. No open wounds bilaterally. No interdigital macerations bilaterally. Toenails 1-5 b/l  elongated, discolored, dystrophic, thickened, crumbly with subungual debris and tenderness to dorsal palpation. Hyperkeratotic lesion(s) L hallux and submet head 3 right foot.  No erythema, no edema, no drainage, no fluctuance.  Musculoskeletal Examination: Normal muscle strength 5/5 to all lower extremity muscle groups bilaterally. No pain crepitus or joint limitation noted with ROM b/l. Hammertoes noted to the b/l lower extremities.  Footwear Assessment: Does the patient wear appropriate shoes? No. Does the patient need inserts/orthotics? Yes.  Neurological Examination: Protective sensation decreased with 10 gram monofilament b/l.  Assessment: 1. Pain due to onychomycosis of toenails of both feet   2. Callus   3. Multiple sclerosis (HCC)   4. Acquired hammertoes of both feet   5. Type 2 diabetes mellitus with other specified complication, without long-term current use of insulin (HCC)   6. Encounter for diabetic foot exam (HCC)      ADA Risk Categorization: High Risk  Patient has one or more of the following: Loss of protective sensation Absent pedal pulses Severe Foot deformity History of foot ulcer  Plan: -Examined patient. -Diabetic foot examination performed on today's visit. -Continue diabetic foot care principles. -Patient to continue soft, supportive shoe gear daily. -Toenails 1-5 b/l were debrided in length and girth with sterile nail nippers and dremel without iatrogenic bleeding.  -Callus(es) L hallux and submet head 3 right foot pared utilizing sterile scalpel blade without complication or incident. Total number debrided =2. -Patient to report any pedal injuries to medical professional immediately. -Recommended Skechers shoes with stretchable uppers and memory foam insoles. -Patient/POA to call should there be question/concern in the interim.  Return in about 3 months (around 09/03/2020).  Freddie Breech, DPM

## 2020-06-27 DIAGNOSIS — I2510 Atherosclerotic heart disease of native coronary artery without angina pectoris: Secondary | ICD-10-CM | POA: Diagnosis not present

## 2020-06-27 DIAGNOSIS — E785 Hyperlipidemia, unspecified: Secondary | ICD-10-CM | POA: Diagnosis not present

## 2020-06-27 DIAGNOSIS — E1169 Type 2 diabetes mellitus with other specified complication: Secondary | ICD-10-CM | POA: Diagnosis not present

## 2020-06-27 DIAGNOSIS — I10 Essential (primary) hypertension: Secondary | ICD-10-CM | POA: Diagnosis not present

## 2020-07-04 DIAGNOSIS — H401134 Primary open-angle glaucoma, bilateral, indeterminate stage: Secondary | ICD-10-CM | POA: Diagnosis not present

## 2020-07-04 DIAGNOSIS — R2681 Unsteadiness on feet: Secondary | ICD-10-CM | POA: Diagnosis not present

## 2020-07-04 DIAGNOSIS — G35 Multiple sclerosis: Secondary | ICD-10-CM | POA: Diagnosis not present

## 2020-07-04 DIAGNOSIS — E1169 Type 2 diabetes mellitus with other specified complication: Secondary | ICD-10-CM | POA: Diagnosis not present

## 2020-07-04 DIAGNOSIS — I2510 Atherosclerotic heart disease of native coronary artery without angina pectoris: Secondary | ICD-10-CM | POA: Diagnosis not present

## 2020-07-04 DIAGNOSIS — I10 Essential (primary) hypertension: Secondary | ICD-10-CM | POA: Diagnosis not present

## 2020-07-09 ENCOUNTER — Other Ambulatory Visit: Payer: Self-pay | Admitting: Internal Medicine

## 2020-07-09 DIAGNOSIS — E2839 Other primary ovarian failure: Secondary | ICD-10-CM

## 2020-07-10 ENCOUNTER — Telehealth: Payer: Self-pay

## 2020-07-10 NOTE — Telephone Encounter (Signed)
It remains unclear to me why she is on Eliquis.  She was started on this in [place].  There is no definitive diagnosis of A. fib, DVT, PE that I am aware of.  I have not received any records from [place] that explain the need for eliquis. Recommend stopping eliquis for now. Also. Can use over the counter Affrin spray. If symptoms do not improve, recommend contacting PCP/ENT. If continuous bleeding, go to urgent care/ER.  Thanks MJP    I will obtain more records from [place].

## 2020-07-10 NOTE — Telephone Encounter (Signed)
Called and spoke to pt regarding eliquis and advice regarding nose bleeds. Pt voiced understanding.

## 2020-07-10 NOTE — Telephone Encounter (Signed)
Pt called because she started taking eliquis 2.5mg . in January 2020. She said he nose started bleeding yesterday and she was able to get it to stop but it started again shortly after. She said it happened that way for about 3 times. She laid down and it stopped. She is wondering if it could be because of the medication.

## 2020-07-15 NOTE — Progress Notes (Signed)
NEUROLOGY FOLLOW UP OFFICE NOTE  Joanne Hayden [id number]  Assessment/Plan:   1.  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 2.  Recurrent syncope - EEG normal 3.  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  Gabapentin 100mg  at bedtime for neuralgia 2.  MRI of brain and cervical spine with and without contrast 3.  If no objection with cardiology, start D3 4000 IU daily 4.  Will see if we can put in an order for an air mattress 5.  Defer restarting Avonex at this time.  Subjective:  Joanne Hayden is an 80 year old right-handed female with CHF, CAD, diabetes mellitus, HTN and HLD who follows up for multiple sclerosis.  She is accompanied by her son who supplements history.  UPDATE: Current DMT:  None Current medications:  Eliquis, lisinopril, atorvastatin 40mg    Vitamin D level on 01/24/2020 was 25.72.  Advised to start D3 4000 IU daily.  She hasn't started it because she wants to ask her cardiologist if it would be okay. EEG on 01/24/2020 was normal.  Vision:  S/p cataract surgery.  Uses reading glasses. Motor:  Generalized weakness.  Sometimes has spasms/jerking of the left foot if touched. Sensory/Pain:  She may have mild numbness and tingling in the lower extremities, mostly left side.  A few months ago, she developed a sharp pain from back of neck down right shoulder and into the whole hand.  The pain prevented her from raising her arm.  It lasted weeks, only recently started getting better.  On April 1, she was being carried down steps in her wheelchair and accidentally was dropped falling on the left side.  Since then, she has pain down the left arm and leg.  It is an aching pain.   Gait:  Ambulates with walker at home.  Uses a wheelchair outside. Bowel/Bladder:  No issues Fatigue:  Chronic Cognition:  Memory okay Mood:  ok She explains that she has significant pain in her neck, back, shoulders and legs.  She  would like some bed with an air mattress that helps minimize the pain as laying on a regular mattress is uncomfortable.  HISTORY: She was diagnosed with multiple sclerosis in 1985.  Around 1980 she had numbness in right toe that traveled up to her right knee lasting 2-3 weeks. She had a recurrent episode about a year later but the numbness traveled up her thigh.  It occurred a third time and she was referred to neurology when she had an MRI of the brain that revealed an MS plaque on the right cerebral hemisphere.  She also had abnormal CSF analysis.  She was started on Avonex for 30 years until 2021.  She stopped because she had an MI in 2020.  While on Avonex, she continued to have episodes of numbness and tingling.  Gradually she developed increased weakness in the legs and difficulty ambulating.     Past DMT:  Avonex (took for 30 years, stopped April 2021)  She reports episodes of losing consciousness.  No preceding headache, lightheadedness/dizziness, diaphoresis, chest pain, shortness of breath, or palpitations.   No warning.  Never witnessed.  She is unconscious for brief period.  The first time it occurred, it was after an Avonex injection.  She started having full body shaking and lowered herself to the floor.  She doesn't remember if she had passed out but she realized she had urinated but no tongue biting.  She  had two subsequent episodes not preceded by shaking.  The last episode occurred after her Moderna COVID shot in April 2021.  When she woke up on the floor, she did not have incontinence or tongue biting.   PAST MEDICAL HISTORY: Past Medical History:  Diagnosis Date  . CHF (congestive heart failure) (HCC)   . Diabetes mellitus without complication (HCC)   . Heart attack (HCC)   . Hyperlipidemia   . Hypertension   . Multiple sclerosis (HCC)     MEDICATIONS: Current Outpatient Medications on File Prior to Visit  Medication Sig Dispense Refill  . acetaminophen (TYLENOL) 325 MG  tablet Take 650 mg by mouth every 6 (six) hours as needed.    Marland Kitchen aspirin EC 81 MG tablet Take 81 mg by mouth daily. Swallow whole.    Marland Kitchen atorvastatin (LIPITOR) 40 MG tablet Take 40 mg by mouth at bedtime.    . dorzolamide-timolol (COSOPT) 22.3-6.8 MG/ML ophthalmic solution 1 drop 2 (two) times daily.    Marland Kitchen ELIQUIS 2.5 MG TABS tablet Take 2.5 mg by mouth 2 (two) times daily.    Marland Kitchen latanoprost (XALATAN) 0.005 % ophthalmic solution 1 drop at bedtime.    Marland Kitchen lisinopril (ZESTRIL) 10 MG tablet Take 1 tablet (10 mg total) by mouth daily. 90 tablet 1   No current facility-administered medications on file prior to visit.    ALLERGIES: Allergies  Allergen Reactions  . Codeine     Other reaction(s): Unknown  . Narcan [Naloxone]     Other reaction(s): Unknown  . Nitrofurantoin     Other reaction(s): Unknown  . Sulfa Antibiotics     Other reaction(s): Unknown    FAMILY HISTORY: Family History  Problem Relation Age of Onset  . Hypertension Sister       Objective:  Blood pressure 118/69, pulse 60, height 5\' 2"  (1.575 m), weight 98 lb (44.5 kg), SpO2 99 %. General: No acute distress.  Patient appears well-groomed.   Head:  Normocephalic/atraumatic Eyes:  Fundi examined but not visualized Neck: supple, no paraspinal tenderness, full range of motion Heart:  Regular rate and rhythm Lungs:  Clear to auscultation bilaterally Back: No paraspinal tenderness Neurological Exam: alert and oriented to person, place, and time. Speech fluent and not dysarthric, language intact.  CN II-XII intact. Bulk and tone normal, muscle strength  5-/5 bilateral upper extremities, 2/5 bilateral hip flexion, 3+/5 right knee extension/flexion, right foot drop, otherwise 5/5.  Sensation to pinprick intact, vibratory sensation reduced in feet.  Deep tendon reflexes 3+ throughout, toes upgoing  Nonambulatory.  In wheelchair.  Marland Kitchen, DO  CC: Shon Millet

## 2020-07-16 ENCOUNTER — Encounter: Payer: Self-pay | Admitting: Neurology

## 2020-07-16 ENCOUNTER — Other Ambulatory Visit: Payer: Self-pay

## 2020-07-16 ENCOUNTER — Ambulatory Visit: Payer: Medicare Other | Admitting: Neurology

## 2020-07-16 VITALS — BP 118/69 | HR 60 | Ht 62.0 in | Wt 98.0 lb

## 2020-07-16 DIAGNOSIS — M5412 Radiculopathy, cervical region: Secondary | ICD-10-CM | POA: Diagnosis not present

## 2020-07-16 DIAGNOSIS — G35 Multiple sclerosis: Secondary | ICD-10-CM | POA: Diagnosis not present

## 2020-07-16 MED ORDER — GABAPENTIN 100 MG PO CAPS: 100.0000 mg | ORAL_CAPSULE | Freq: Every day | ORAL | 5 refills | Status: DC

## 2020-07-16 NOTE — Patient Instructions (Signed)
1.  For nerve pain, start gabapentin 100mg  at bedtime 2.  Check MRI of brain and cervical spine with and without contrast 3.  Will look into the air mattress 4.  If okay with your cardiologist, start D3 4000 IU daily 5.  Follow up 6 months.

## 2020-07-18 DIAGNOSIS — I10 Essential (primary) hypertension: Secondary | ICD-10-CM | POA: Diagnosis not present

## 2020-07-18 DIAGNOSIS — I2510 Atherosclerotic heart disease of native coronary artery without angina pectoris: Secondary | ICD-10-CM | POA: Diagnosis not present

## 2020-07-18 DIAGNOSIS — E785 Hyperlipidemia, unspecified: Secondary | ICD-10-CM | POA: Diagnosis not present

## 2020-07-18 DIAGNOSIS — E1169 Type 2 diabetes mellitus with other specified complication: Secondary | ICD-10-CM | POA: Diagnosis not present

## 2020-08-11 ENCOUNTER — Other Ambulatory Visit: Payer: Medicare Other

## 2020-08-12 ENCOUNTER — Other Ambulatory Visit: Payer: Medicare Other

## 2020-08-18 ENCOUNTER — Telehealth: Payer: Self-pay

## 2020-08-18 NOTE — Telephone Encounter (Signed)
Patient called and wanted to let you know that she recently mailed her medical release form back to [place] and is waiting for them to fax records back.

## 2020-08-20 DIAGNOSIS — E1169 Type 2 diabetes mellitus with other specified complication: Secondary | ICD-10-CM | POA: Diagnosis not present

## 2020-08-20 DIAGNOSIS — I2510 Atherosclerotic heart disease of native coronary artery without angina pectoris: Secondary | ICD-10-CM | POA: Diagnosis not present

## 2020-08-20 DIAGNOSIS — E785 Hyperlipidemia, unspecified: Secondary | ICD-10-CM | POA: Diagnosis not present

## 2020-08-20 DIAGNOSIS — I10 Essential (primary) hypertension: Secondary | ICD-10-CM | POA: Diagnosis not present

## 2020-08-26 NOTE — Progress Notes (Signed)
Patient referred by [name] for coronary artery disease with history of MI.   Subjective:   Joanne Hayden, female    DOB: Nov 25, 1940, 80 y.o.   MRN: [id number]   Chief Complaint  Patient presents with  . Coronary Artery Disease  . Hypertension  . Follow-up    6 month    HPI   Joanne Hayden is 80 y.o. AA female with hypertension, hyperlipidemia, type 2 diabetes mellitus, CAD, multiple sclerosis, primary open-angle glaucoma.  Patient has not had any new symptoms since last visit. Echocardiogram, LE venous duplex and VQ scan results reviewed with the patient, details below.   Patient is here today with her son.  She is doing well, and does not have any chest pain, shortness of breath.  She has occasional nose bleed. I am still not clear why she is on eliquis.   Initial consultation HPI 11/28/2019: Patient moved to [facility] in 2021 from [place] to be closer to family.  She currently lives with her son, who accompanies her at the visit today. She has history of MI in January 2020 treated with PCI. According to patient she has had an echocardiogram several months ago, but it is unclear if she has had a stress test in the past. She currently reports constant non-radiating mild chest pain, not associated with activity. Denies associated symptoms.   Patient reports occasional episodes of syncope. Her last episode was in April 2021 and occurred while walking. Denies associated chest pain, shortness of breath, dizziness. Reports she does not recall the event well.   Of note patient is currently taking Eliquis, although it is not clear why.  Patient has diagnosis of multiple sclerosis since 1985. She reports chronic generalized weakness and fatigue as a result of her MS. She ambulates using a walker at home, prefers wheelchair when outside.     Current Outpatient Medications on File Prior to Visit  Medication Sig Dispense Refill  . acetaminophen (TYLENOL) 325 MG  tablet Take 650 mg by mouth every 6 (six) hours as needed.    Marland Kitchen aspirin EC 81 MG tablet Take 81 mg by mouth daily. Swallow whole.    Marland Kitchen atorvastatin (LIPITOR) 40 MG tablet Take 40 mg by mouth at bedtime.    . dorzolamide-timolol (COSOPT) 22.3-6.8 MG/ML ophthalmic solution 1 drop 2 (two) times daily.    Marland Kitchen ELIQUIS 2.5 MG TABS tablet Take 2.5 mg by mouth 2 (two) times daily.    Marland Kitchen gabapentin (NEURONTIN) 100 MG capsule Take 1 capsule (100 mg total) by mouth at bedtime. 30 capsule 5  . latanoprost (XALATAN) 0.005 % ophthalmic solution 1 drop at bedtime.    Marland Kitchen lisinopril (ZESTRIL) 10 MG tablet Take 1 tablet (10 mg total) by mouth daily. 90 tablet 1   No current facility-administered medications on file prior to visit.    Cardiovascular and other pertinent studies:  EKG 08/27/2020: Sinus rhythm 85 bpm Diffuse low voltage Left atrial enlargement  Lower Extremity Venous Duplex (bilateral) 02/15/2020:  No evidence of deep vein thrombosis of the lower extremities with normal  venous return.  VQ scan 02/12/2020: No evidence acute pulmonary embolism.  Lexiscan Tetrofosmin Stress Test  01/09/2020: Nondiagnostic ECG stress. Resting EKG/ECG demonstrated normal sinus rhythm. Observed anteroseptal infarct. No ST-T wave abnormalities.  Peak EKG/ECG revealed no ST-T wave abnormalities. Rare PACs and PVCs.  Myocardial perfusion is normal. Although the TID ratio 1.47 is abnormal, visually I do not agree with the findings and LV appears to be normal/small  sized in both rest and stress images.  LV end-diastolic volume was 50 mL. Overall LV systolic function is normal without regional wall motion abnormalities. Stress LV EF: 61%.  No previous exam available for comparison. Low risk.   Echocardiogram 12/20/2019:  Left ventricle cavity is normal in size. Moderate concentric hypertrophy  of the left ventricle. Normal global wall motion. Normal LV systolic  function with EF 55%. Doppler evidence of grade I (impaired)  diastolic  dysfunction, normal LAP.  Left atrial cavity is slightly dilated. Aneurysmal interatrial septum  without 2D or color Doppler evidence of interatrial shunt.  Right atrial cavity is moderately dilated.  Trileaflet aortic valve. Moderate (Grade II) aortic regurgitation.  Moderate (Grade II) mitral regurgitation.  Severe tricuspid regurgitation. Estimated pulmonary artery systolic  pressure 25 mmHg.  EKG 11/28/2019: Sinus rhythm 86 bpm Possible old posterior infarct Occasional PAC    Left atrial enlargement.   Recent labs: 10/15/2019: Glucose 93, BUN/Cr 13/0.65. EGFR 88. Na/K 141/4.8. Rest of the CMP normal H/H 13/39. MCV 91. Platelets 147 Chol 96, TG 32, HDL 51, LDL 36 No TSH or A1C available.   Review of Systems  Cardiovascular: Positive for chest pain and leg swelling (bilateral). Negative for claudication, dyspnea on exertion, orthopnea, palpitations and syncope.  Respiratory: Negative for shortness of breath.   Musculoskeletal: Positive for muscle weakness (Chronic).  Gastrointestinal: Negative for melena.  Neurological: Positive for weakness (Chronic). Negative for dizziness, light-headedness and numbness.        Vitals:   08/27/20 1033  BP: (!) 139/96  Pulse: 92  Temp: (!) 92 F (33.3 C)  SpO2: 97%     Body mass index is 17.92 kg/m.   Objective:   Physical Exam Constitutional:      Appearance: She is normal weight.     Comments: In wheelchair   Cardiovascular:     Rate and Rhythm: Normal rate and regular rhythm.     Pulses:          Radial pulses are 2+ on the right side and 2+ on the left side.       Dorsalis pedis pulses are 1+ on the right side and 1+ on the left side.       Posterior tibial pulses are 1+ on the right side and 1+ on the left side.     Heart sounds: Murmur heard.   Systolic murmur is present with a grade of 3/6 at the upper right sternal border.   Pulmonary:     Effort: Pulmonary effort is normal.     Breath sounds:  Normal breath sounds.  Musculoskeletal:     Right lower leg: No edema.     Left lower leg: No edema.  Neurological:     Mental Status: She is alert.     Comments: Gait and balance limitations due to MS          Assessment & Recommendations:   80 y.o. AA female with hypertension, hyperlipidemia, type 2 diabetes mellitus, CAD, multiple sclerosis, primary open-angle glaucoma, mod-severe tricuspid regurgitation, h/o syncope.  Tricuspid regurgitation: Given her limited mobility due to MS< leg swelling, h/o syncope, and mod-severe tricuspid regurgitation, I obtained LE venous duplex, chest Xray, VQ scan, all of which were normal.  She is asymptomatic from tricuspid regurgitation and does not need any intervention at this time.  Essential Hypertension:  Fairly well controlled.  Coronary Artery Disease:   No angina. No ischemia on stress test. Continue aspirin, atorvastatin, and lisinopril.   It  remains unclear to me why patient is on Eliquis.  She was started on this in [place].  There is no definitive diagnosis of A. fib, DVT, PE that I am aware of.  I still have not received any records from [place]. I explained to the patient that her bleeding risk is elevated with concurrent use of Aspirin and eliquis. If no definite indication, stop eliquis. If definite indication for eliquis, stop Aspirin. She is not comfortable with stopping any right now. I will await records from [place].   F/u in 6 months    Nigel Mormon, MD Pager: [phone] Office: [phone]

## 2020-08-27 ENCOUNTER — Encounter: Payer: Self-pay | Admitting: Cardiology

## 2020-08-27 ENCOUNTER — Other Ambulatory Visit: Payer: Self-pay

## 2020-08-27 ENCOUNTER — Ambulatory Visit: Payer: Medicare Other | Admitting: Cardiology

## 2020-08-27 VITALS — BP 139/96 | HR 92 | Temp 92.0°F | Ht 62.0 in

## 2020-08-27 DIAGNOSIS — I361 Nonrheumatic tricuspid (valve) insufficiency: Secondary | ICD-10-CM

## 2020-08-27 DIAGNOSIS — I10 Essential (primary) hypertension: Secondary | ICD-10-CM | POA: Diagnosis not present

## 2020-08-27 DIAGNOSIS — I2510 Atherosclerotic heart disease of native coronary artery without angina pectoris: Secondary | ICD-10-CM

## 2020-09-16 ENCOUNTER — Ambulatory Visit: Payer: Medicare Other | Admitting: Podiatry

## 2020-09-26 DIAGNOSIS — I2510 Atherosclerotic heart disease of native coronary artery without angina pectoris: Secondary | ICD-10-CM | POA: Diagnosis not present

## 2020-09-26 DIAGNOSIS — E1169 Type 2 diabetes mellitus with other specified complication: Secondary | ICD-10-CM | POA: Diagnosis not present

## 2020-09-26 DIAGNOSIS — E785 Hyperlipidemia, unspecified: Secondary | ICD-10-CM | POA: Diagnosis not present

## 2020-09-26 DIAGNOSIS — I10 Essential (primary) hypertension: Secondary | ICD-10-CM | POA: Diagnosis not present

## 2020-09-26 DIAGNOSIS — Z7984 Long term (current) use of oral hypoglycemic drugs: Secondary | ICD-10-CM | POA: Diagnosis not present

## 2020-10-29 ENCOUNTER — Telehealth: Payer: Self-pay | Admitting: Cardiology

## 2020-11-03 ENCOUNTER — Other Ambulatory Visit: Payer: Self-pay

## 2020-11-21 ENCOUNTER — Other Ambulatory Visit: Payer: Self-pay

## 2020-11-21 DIAGNOSIS — E1169 Type 2 diabetes mellitus with other specified complication: Secondary | ICD-10-CM | POA: Diagnosis not present

## 2020-11-21 DIAGNOSIS — E785 Hyperlipidemia, unspecified: Secondary | ICD-10-CM | POA: Diagnosis not present

## 2020-11-21 DIAGNOSIS — I2510 Atherosclerotic heart disease of native coronary artery without angina pectoris: Secondary | ICD-10-CM | POA: Diagnosis not present

## 2020-11-21 DIAGNOSIS — I10 Essential (primary) hypertension: Secondary | ICD-10-CM | POA: Diagnosis not present

## 2020-11-21 MED ORDER — GABAPENTIN 100 MG PO CAPS: 100.0000 mg | ORAL_CAPSULE | Freq: Every day | ORAL | 1 refills | Status: DC

## 2020-11-25 NOTE — Telephone Encounter (Signed)
Error

## 2020-12-03 ENCOUNTER — Encounter: Payer: Self-pay | Admitting: Podiatry

## 2020-12-03 ENCOUNTER — Other Ambulatory Visit: Payer: Self-pay

## 2020-12-03 ENCOUNTER — Ambulatory Visit (INDEPENDENT_AMBULATORY_CARE_PROVIDER_SITE_OTHER): Payer: Medicare Other | Admitting: Podiatry

## 2020-12-03 DIAGNOSIS — B351 Tinea unguium: Secondary | ICD-10-CM | POA: Diagnosis not present

## 2020-12-03 DIAGNOSIS — L84 Corns and callosities: Secondary | ICD-10-CM | POA: Diagnosis not present

## 2020-12-03 DIAGNOSIS — M79675 Pain in left toe(s): Secondary | ICD-10-CM

## 2020-12-03 DIAGNOSIS — G35 Multiple sclerosis: Secondary | ICD-10-CM | POA: Diagnosis not present

## 2020-12-03 DIAGNOSIS — E1142 Type 2 diabetes mellitus with diabetic polyneuropathy: Secondary | ICD-10-CM

## 2020-12-03 DIAGNOSIS — M79674 Pain in right toe(s): Secondary | ICD-10-CM | POA: Diagnosis not present

## 2020-12-03 DIAGNOSIS — E2839 Other primary ovarian failure: Secondary | ICD-10-CM | POA: Insufficient documentation

## 2020-12-03 NOTE — Patient Instructions (Signed)
Recommend Skechers Loafers with stretchable uppers and memory foam insoles. They can be purchased at [employer] or [email or web address]

## 2020-12-08 NOTE — Progress Notes (Signed)
  Subjective:  Patient ID: Joanne Hayden, female    DOB: January 23, 1941,  MRN: [id number]  80 y.o. female presents with at risk foot care with history of multiple sclerosis, diabetic neuropathy, callus(es) b/l feet and painful thick toenails that are difficult to trim. Painful toenails interfere with ambulation. Aggravating factors include wearing enclosed shoe gear. Pain is relieved with periodic professional debridement. Painful calluses are aggravated when weightbearing with and without shoegear. Pain is relieved with periodic professional debridement..    Patient does not monitor blood glucose on a daily basis.  PCP: Lorenda Ishihara, MD and last visit was: 07/04/2020  Review of Systems: Negative except as noted in the HPI.   Allergies  Allergen Reactions   Codeine     Other reaction(s): Unknown Other reaction(s): Unknown   Naloxone Other (See Comments)    Other reaction(s): Unknown Other reaction(s): Unknown   Nitrofurantoin     Other reaction(s): Unknown Other reaction(s): Unknown   Sulfa Antibiotics     Other reaction(s): Unknown Other reaction(s): Unknown    Objective:  There were no vitals filed for this visit. Constitutional Patient is a pleasant 80 y.o. African American female in NAD. AAO x 3.  Vascular Capillary fill time to digits <3 seconds b/l lower extremities. Faintly palpable DP and PT pulse(s) b/l lower extremities. Pedal hair absent. Lower extremity skin temperature gradient within normal limits. No pain with calf compression b/l. No edema noted b/l lower extremities. No cyanosis or clubbing noted.  Neurologic Normal speech. Protective sensation decreased bilaterally with 10g monofilament.  Dermatologic Pedal skin warm and supple b/l.  No open wounds b/l lower extremities. No interdigital macerations b/l lower extremities. Toenails 1-5 b/l elongated, discolored, dystrophic, thickened, crumbly with subungual debris and tenderness to dorsal palpation. Hyperkeratotic  lesion(s) L hallux and submet head 3 right foot with tenderness to palpation. No edema, no erythema, no drainage, no fluctuance.   Orthopedic: Normal muscle strength 5/5 to all lower extremity muscle groups bilaterally. Hammertoes 2-5 b/l.    Assessment:   1. Pain due to onychomycosis of toenails of both feet   2. Callus   3. Multiple sclerosis (HCC)   4. Diabetic peripheral neuropathy associated with type 2 diabetes mellitus (HCC)    Plan:  Patient was evaluated and treated and all questions answered. Consent given for treatment as described below: -No new findings. No new orders. -Continue diabetic foot care principles: inspect feet daily, monitor glucose as recommended by PCP and/or Endocrinologist, and follow prescribed diet per PCP, Endocrinologist and/or dietician. -Patient to continue soft, supportive shoe gear daily. -Toenails 1-5 b/l were debrided in length and girth with sterile nail nippers and dremel without iatrogenic bleeding.  -Callus(es) L hallux and submet head 3 right foot pared utilizing sterile scalpel blade without complication or incident. Total number debrided =2. -Patient to report any pedal injuries to medical professional immediately. -Recommended Skechers shoes with stretchable uppers and memory foam insoles. -Patient/POA to call should there be question/concern in the interim.  Return in about 3 months (around 03/04/2021).  Freddie Breech, DPM

## 2021-01-01 ENCOUNTER — Ambulatory Visit
Admission: RE | Admit: 2021-01-01 | Discharge: 2021-01-01 | Disposition: A | Discharge status: Home / Self Care | Payer: Medicare Other | Source: Ambulatory Visit | Attending: Internal Medicine | Admitting: Internal Medicine

## 2021-01-01 ENCOUNTER — Other Ambulatory Visit: Payer: Self-pay

## 2021-01-01 DIAGNOSIS — E2839 Other primary ovarian failure: Secondary | ICD-10-CM

## 2021-01-01 DIAGNOSIS — M810 Age-related osteoporosis without current pathological fracture: Secondary | ICD-10-CM | POA: Diagnosis not present

## 2021-01-01 DIAGNOSIS — Z780 Asymptomatic menopausal state: Secondary | ICD-10-CM | POA: Diagnosis not present

## 2021-01-14 NOTE — Progress Notes (Signed)
NEUROLOGY FOLLOW UP OFFICE NOTE  Joanne Hayden [id number]  Assessment/Plan:   Multiple sclerosis  Gabapentin 100mg  at bedtime Refer for home PT Follow up one year   Subjective:  Joanne Hayden is a 80 year old right-handed female with CHF, CAD, diabetes mellitus, HTN and HLD who follows up for multiple sclerosis.     UPDATE: Current DMT:  None Current medications:  Gabapentin 100mg  at bedtime, Eliquis, lisinopril, atorvastatin 40mg    MRI of brain and cervical spine was ordered but she did not get it scheduled.  I think it is okay not to perform as she has inactive secondary progressive MS and it will likely not change management.  Feels like she is getting weaker.      Vision:  S/p cataract surgery.  Uses reading glasses. Motor:  Generalized weakness.  Sometimes has spasms/jerking of the left foot if touched. Sensory/Pain:  She may have mild numbness and tingling in the lower extremities, mostly left side.  A few months ago, she developed a sharp pain from back of neck down right shoulder and into the whole hand.  The pain prevented her from raising her arm.  It lasted weeks, only recently started getting better.  On April 1, she was being carried down steps in her wheelchair and accidentally was dropped falling on the left side.  Since then, she has pain down the left arm and leg.  It is an aching pain.   Gait:  Ambulates with walker at home.  Uses a wheelchair outside. Bowel/Bladder:  No issues Fatigue:  Chronic Cognition:  Memory okay Mood:  ok She explains that she has significant pain in her neck, back, shoulders and legs.  She would like some bed with an air mattress that helps minimize the pain as laying on a regular mattress is uncomfortable.   HISTORY: She was diagnosed with multiple sclerosis in 1985.  Around 1980 she had numbness in right toe that traveled up to her right knee lasting 2-3 weeks. She had a recurrent episode about a year later but the numbness traveled  up her thigh.  It occurred a third time and she was referred to neurology when she had an MRI of the brain that revealed an MS plaque on the right cerebral hemisphere.  She also had abnormal CSF analysis.  She was started on Avonex for 30 years until 2021.  She stopped because she had an MI in 2020.  While on Avonex, she continued to have episodes of numbness and tingling.  Gradually she developed increased weakness in the legs and difficulty ambulating.      Past DMT:  Avonex (took for 30 years, stopped April 2021)   She reports episodes of losing consciousness.  No preceding headache, lightheadedness/dizziness, diaphoresis, chest pain, shortness of breath, or palpitations.   No warning.  Never witnessed.  She is unconscious for brief period.  The first time it occurred, it was after an Avonex injection.  She started having full body shaking and lowered herself to the floor.  She doesn't remember if she had passed out but she realized she had urinated but no tongue biting.  She had two subsequent episodes not preceded by shaking.  The last episode occurred after her Moderna COVID shot in April 2021.  When she woke up on the floor, she did not have incontinence or tongue biting.  EEG on 01/24/2020 was normal.  PAST MEDICAL HISTORY: Past Medical History:  Diagnosis Date   CHF (congestive heart failure) (HCC)  Diabetes mellitus without complication (HCC)    Heart attack (HCC)    Hyperlipidemia    Hypertension    Multiple sclerosis (HCC)     MEDICATIONS: Current Outpatient Medications on File Prior to Visit  Medication Sig Dispense Refill   acetaminophen (TYLENOL) 325 MG tablet Take 650 mg by mouth every 6 (six) hours as needed.     aspirin EC 81 MG tablet Take 81 mg by mouth daily. Swallow whole.     atorvastatin (LIPITOR) 40 MG tablet Take 40 mg by mouth at bedtime.     Continuous Blood Gluc Receiver (FREESTYLE LIBRE 14 DAY READER) DEVI See admin instructions.     Continuous Blood Gluc  Sensor (FREESTYLE LIBRE 14 DAY SENSOR) MISC See admin instructions.     dorzolamide-timolol (COSOPT) 22.3-6.8 MG/ML ophthalmic solution 1 drop 2 (two) times daily.     ELIQUIS 2.5 MG TABS tablet Take 2.5 mg by mouth 2 (two) times daily.     gabapentin (NEURONTIN) 100 MG capsule Take 1 capsule (100 mg total) by mouth at bedtime. 90 capsule 1   latanoprost (XALATAN) 0.005 % ophthalmic solution 1 drop at bedtime.     lisinopril (ZESTRIL) 10 MG tablet Take 1 tablet (10 mg total) by mouth daily. 90 tablet 1   Netarsudil Dimesylate (RHOPRESSA) 0.02 % SOLN 1 drop into affected eye in the evening     No current facility-administered medications on file prior to visit.    ALLERGIES: Allergies  Allergen Reactions   Codeine     Other reaction(s): Unknown Other reaction(s): Unknown   Naloxone Other (See Comments)    Other reaction(s): Unknown Other reaction(s): Unknown   Nitrofurantoin     Other reaction(s): Unknown Other reaction(s): Unknown   Sulfa Antibiotics     Other reaction(s): Unknown Other reaction(s): Unknown    FAMILY HISTORY: Family History  Problem Relation Age of Onset   Hypertension Sister       Objective:   Vitals:   01/16/21 1321  BP: 108/76  Pulse: 82  SpO2: 98%   General: No acute distress.  Patient appears well-groomed.   Head:  Normocephalic/atraumatic Eyes:  Fundi examined but not visualized Neck: supple, no paraspinal tenderness, full range of motion Heart:  Regular rate and rhythm Lungs:  Clear to auscultation bilaterally Back: No paraspinal tenderness Neurological Exam: alert and oriented to person, place, and time. Speech fluent and not dysarthric, language intact.  CN II-XII intact. Bulk and tone normal, muscle strength  5-/5 bilateral upper extremities, 2/5 bilateral hip flexion, 3+/5 right knee extension/flexion, right foot drop, otherwise 5/5.Marland Kitchen  Sensation to pinprick intact, vibratory sensation reduced in feet.  Deep tendon reflexes 3+ throughout,  toes upgoing  Nonambulatory.  In wheelchair.   Shon Millet, DO  CC: Lorenda Ishihara, MD

## 2021-01-16 ENCOUNTER — Encounter: Payer: Self-pay | Admitting: Neurology

## 2021-01-16 ENCOUNTER — Ambulatory Visit (INDEPENDENT_AMBULATORY_CARE_PROVIDER_SITE_OTHER): Payer: Medicare Other | Admitting: Neurology

## 2021-01-16 ENCOUNTER — Other Ambulatory Visit: Payer: Self-pay

## 2021-01-16 VITALS — BP 108/76 | HR 82 | Ht 63.0 in | Wt 100.0 lb

## 2021-01-16 DIAGNOSIS — G35 Multiple sclerosis: Secondary | ICD-10-CM

## 2021-01-16 NOTE — Patient Instructions (Signed)
Will order you physical therapy

## 2021-02-04 DIAGNOSIS — Z0000 Encounter for general adult medical examination without abnormal findings: Secondary | ICD-10-CM | POA: Diagnosis not present

## 2021-02-04 DIAGNOSIS — Z1389 Encounter for screening for other disorder: Secondary | ICD-10-CM | POA: Diagnosis not present

## 2021-02-04 DIAGNOSIS — R2681 Unsteadiness on feet: Secondary | ICD-10-CM | POA: Diagnosis not present

## 2021-02-04 DIAGNOSIS — H401134 Primary open-angle glaucoma, bilateral, indeterminate stage: Secondary | ICD-10-CM | POA: Diagnosis not present

## 2021-02-04 DIAGNOSIS — I2510 Atherosclerotic heart disease of native coronary artery without angina pectoris: Secondary | ICD-10-CM | POA: Diagnosis not present

## 2021-02-04 DIAGNOSIS — Z23 Encounter for immunization: Secondary | ICD-10-CM | POA: Diagnosis not present

## 2021-02-04 DIAGNOSIS — E1169 Type 2 diabetes mellitus with other specified complication: Secondary | ICD-10-CM | POA: Diagnosis not present

## 2021-02-04 DIAGNOSIS — E440 Moderate protein-calorie malnutrition: Secondary | ICD-10-CM | POA: Diagnosis not present

## 2021-02-04 DIAGNOSIS — I10 Essential (primary) hypertension: Secondary | ICD-10-CM | POA: Diagnosis not present

## 2021-02-04 DIAGNOSIS — Z7189 Other specified counseling: Secondary | ICD-10-CM | POA: Diagnosis not present

## 2021-02-04 DIAGNOSIS — E785 Hyperlipidemia, unspecified: Secondary | ICD-10-CM | POA: Diagnosis not present

## 2021-02-11 DIAGNOSIS — M79671 Pain in right foot: Secondary | ICD-10-CM | POA: Diagnosis not present

## 2021-02-11 DIAGNOSIS — I10 Essential (primary) hypertension: Secondary | ICD-10-CM | POA: Diagnosis not present

## 2021-02-11 DIAGNOSIS — L84 Corns and callosities: Secondary | ICD-10-CM | POA: Diagnosis not present

## 2021-02-16 ENCOUNTER — Other Ambulatory Visit: Payer: Self-pay

## 2021-02-16 ENCOUNTER — Ambulatory Visit (INDEPENDENT_AMBULATORY_CARE_PROVIDER_SITE_OTHER): Payer: Medicare Other | Admitting: Podiatry

## 2021-02-16 ENCOUNTER — Encounter: Payer: Self-pay | Admitting: Podiatry

## 2021-02-16 DIAGNOSIS — M79675 Pain in left toe(s): Secondary | ICD-10-CM | POA: Diagnosis not present

## 2021-02-16 DIAGNOSIS — B351 Tinea unguium: Secondary | ICD-10-CM

## 2021-02-16 DIAGNOSIS — L84 Corns and callosities: Secondary | ICD-10-CM | POA: Diagnosis not present

## 2021-02-16 DIAGNOSIS — M79674 Pain in right toe(s): Secondary | ICD-10-CM | POA: Diagnosis not present

## 2021-02-16 DIAGNOSIS — E1142 Type 2 diabetes mellitus with diabetic polyneuropathy: Secondary | ICD-10-CM

## 2021-02-16 DIAGNOSIS — G35 Multiple sclerosis: Secondary | ICD-10-CM

## 2021-02-16 NOTE — Patient Instructions (Signed)
Joanne Hayden, you will have two upcoming appointments:  We will measure you for diabetic shoes in January.  Next foot appointment will be in February for you calluses and toenails (diabetic foot care)

## 2021-02-20 ENCOUNTER — Encounter: Payer: Self-pay | Admitting: Podiatry

## 2021-02-20 NOTE — Progress Notes (Signed)
Subjective: Joanne Hayden is a pleasant 80 y.o. female patient seen today for at risk foot care with history of diabetic neuropathy and callus(es) bilaterally and painful thick toenails that are difficult to trim. Painful toenails interfere with ambulation. Aggravating factors include wearing enclosed shoe gear. Pain is relieved with periodic professional debridement. Painful calluses are aggravated when weightbearing with and without shoegear. Pain is relieved with periodic professional debridement.   Patient did not check blood glucose on today.  PCP is Lorenda Ishihara, MD. Last visit was: 02/11/2021.  Allergies  Allergen Reactions   Codeine     Other reaction(s): Unknown Other reaction(s): Unknown   Naloxone Other (See Comments)    Other reaction(s): Unknown Other reaction(s): Unknown Other reaction(s): Unknown   Nitrofurantoin     Other reaction(s): Unknown Other reaction(s): Unknown   Sulfa Antibiotics     Other reaction(s): Unknown Other reaction(s): Unknown    Objective: Physical Exam  General: Joanne Hayden is a pleasant 80 y.o. African American female, WD, WN in NAD. AAO x 3.   Vascular:  CFT <3 seconds b/l LE. Faintly palpable pedal pulses b/l LE. Pedal hair absent b/l LE. Skin temperature gradient WNL b/l. No pain with calf compression b/l. No edema b/l LE. No cyanosis or clubbing noted b/l LE.   Dermatological:  Pedal integument with normal turgor, texture and tone BLE. No open wounds b/l LE. No interdigital macerations noted b/l LE. Toenails 1-5 b/l elongated, discolored, dystrophic, thickened, crumbly with subungual debris and tenderness to dorsal palpation. Hyperkeratotic lesion(s) L hallux and submet head 3 right foot.  No erythema, no edema, no drainage, no fluctuance.   Musculoskeletal:  Muscle strength 3/5 to all lower extremity muscle groups bilaterally. Hammertoe deformity noted 2-5 b/l. Utilizes motorized chair for mobility assistance.    Neurological:  Protective sensation decreased with 10 gram monofilament b/l.   Assessment and Plan:  1. Pain due to onychomycosis of toenails of both feet   2. Callus   3. Multiple sclerosis (HCC)   4. Diabetic peripheral neuropathy associated with type 2 diabetes mellitus (HCC)     Patient was evaluated and treated and all questions answered. Consent given for treatment as described below: -Examined patient. -Patient to continue soft, supportive shoe gear daily. Start procedure for diabetic shoes. Patient qualifies based on diagnoses. -Mycotic toenails 1-5 bilaterally were debrided in length and girth with sterile nail nippers and dremel without incident. -Callus(es) L hallux and submet head 3 right foot pared utilizing sterile scalpel blade without complication or incident. Total number debrided =2. -Patient/POA to call should there be question/concern in the interim.  Return in about 3 months (around 05/19/2021).  Freddie Breech, DPM

## 2021-02-23 ENCOUNTER — Inpatient Hospital Stay (HOSPITAL_COMMUNITY)
Admission: EM | Admit: 2021-02-23 | Discharge: 2021-02-25 | DRG: 282 | Disposition: A | Discharge status: Home / Self Care | Payer: Medicare Other | Source: Ambulatory Visit | Attending: Cardiology | Admitting: Cardiology

## 2021-02-23 ENCOUNTER — Encounter: Payer: Self-pay | Admitting: Cardiology

## 2021-02-23 ENCOUNTER — Other Ambulatory Visit: Payer: Self-pay

## 2021-02-23 ENCOUNTER — Emergency Department (HOSPITAL_COMMUNITY): Payer: Medicare Other

## 2021-02-23 ENCOUNTER — Ambulatory Visit: Payer: Medicare Other | Admitting: Cardiology

## 2021-02-23 VITALS — BP 83/47 | HR 102 | Temp 98.0°F | Resp 16 | Ht 63.0 in

## 2021-02-23 DIAGNOSIS — Z955 Presence of coronary angioplasty implant and graft: Secondary | ICD-10-CM | POA: Diagnosis not present

## 2021-02-23 DIAGNOSIS — I480 Paroxysmal atrial fibrillation: Secondary | ICD-10-CM

## 2021-02-23 DIAGNOSIS — I071 Rheumatic tricuspid insufficiency: Secondary | ICD-10-CM | POA: Diagnosis present

## 2021-02-23 DIAGNOSIS — Z8249 Family history of ischemic heart disease and other diseases of the circulatory system: Secondary | ICD-10-CM | POA: Diagnosis not present

## 2021-02-23 DIAGNOSIS — Z882 Allergy status to sulfonamides status: Secondary | ICD-10-CM

## 2021-02-23 DIAGNOSIS — I499 Cardiac arrhythmia, unspecified: Secondary | ICD-10-CM | POA: Diagnosis not present

## 2021-02-23 DIAGNOSIS — I25119 Atherosclerotic heart disease of native coronary artery with unspecified angina pectoris: Secondary | ICD-10-CM | POA: Diagnosis present

## 2021-02-23 DIAGNOSIS — Z79899 Other long term (current) drug therapy: Secondary | ICD-10-CM

## 2021-02-23 DIAGNOSIS — Z87891 Personal history of nicotine dependence: Secondary | ICD-10-CM | POA: Diagnosis not present

## 2021-02-23 DIAGNOSIS — Z885 Allergy status to narcotic agent status: Secondary | ICD-10-CM | POA: Diagnosis not present

## 2021-02-23 DIAGNOSIS — Z20822 Contact with and (suspected) exposure to covid-19: Secondary | ICD-10-CM | POA: Diagnosis not present

## 2021-02-23 DIAGNOSIS — I10 Essential (primary) hypertension: Secondary | ICD-10-CM

## 2021-02-23 DIAGNOSIS — Z7982 Long term (current) use of aspirin: Secondary | ICD-10-CM | POA: Diagnosis not present

## 2021-02-23 DIAGNOSIS — I214 Non-ST elevation (NSTEMI) myocardial infarction: Secondary | ICD-10-CM | POA: Diagnosis present

## 2021-02-23 DIAGNOSIS — I25110 Atherosclerotic heart disease of native coronary artery with unstable angina pectoris: Secondary | ICD-10-CM

## 2021-02-23 DIAGNOSIS — Z743 Need for continuous supervision: Secondary | ICD-10-CM | POA: Diagnosis not present

## 2021-02-23 DIAGNOSIS — R079 Chest pain, unspecified: Secondary | ICD-10-CM

## 2021-02-23 DIAGNOSIS — Z888 Allergy status to other drugs, medicaments and biological substances status: Secondary | ICD-10-CM | POA: Diagnosis not present

## 2021-02-23 DIAGNOSIS — H401190 Primary open-angle glaucoma, unspecified eye, stage unspecified: Secondary | ICD-10-CM | POA: Diagnosis present

## 2021-02-23 DIAGNOSIS — G35 Multiple sclerosis: Secondary | ICD-10-CM | POA: Diagnosis not present

## 2021-02-23 DIAGNOSIS — Z7901 Long term (current) use of anticoagulants: Secondary | ICD-10-CM

## 2021-02-23 DIAGNOSIS — R778 Other specified abnormalities of plasma proteins: Secondary | ICD-10-CM | POA: Diagnosis not present

## 2021-02-23 DIAGNOSIS — E119 Type 2 diabetes mellitus without complications: Secondary | ICD-10-CM | POA: Diagnosis present

## 2021-02-23 DIAGNOSIS — R0789 Other chest pain: Secondary | ICD-10-CM | POA: Diagnosis not present

## 2021-02-23 DIAGNOSIS — I517 Cardiomegaly: Secondary | ICD-10-CM | POA: Diagnosis not present

## 2021-02-23 DIAGNOSIS — E785 Hyperlipidemia, unspecified: Secondary | ICD-10-CM | POA: Diagnosis present

## 2021-02-23 DIAGNOSIS — I252 Old myocardial infarction: Secondary | ICD-10-CM | POA: Diagnosis not present

## 2021-02-23 DIAGNOSIS — I4891 Unspecified atrial fibrillation: Secondary | ICD-10-CM | POA: Diagnosis not present

## 2021-02-23 DIAGNOSIS — I21A1 Myocardial infarction type 2: Secondary | ICD-10-CM | POA: Diagnosis present

## 2021-02-23 LAB — CBC
HCT: 42.4 % (ref 36.0–46.0)
Hemoglobin: 13.3 g/dL (ref 12.0–15.0)
MCH: 29.7 pg (ref 26.0–34.0)
MCHC: 31.4 g/dL (ref 30.0–36.0)
MCV: 94.6 fL (ref 80.0–100.0)
Platelets: 182 10*3/uL (ref 150–400)
RBC: 4.48 MIL/uL (ref 3.87–5.11)
RDW: 15 % (ref 11.5–15.5)
WBC: 4.7 10*3/uL (ref 4.0–10.5)
nRBC: 0 % (ref 0.0–0.2)

## 2021-02-23 LAB — BASIC METABOLIC PANEL
Anion gap: 7 (ref 5–15)
BUN: 16 mg/dL (ref 8–23)
CO2: 28 mmol/L (ref 22–32)
Calcium: 10.1 mg/dL (ref 8.9–10.3)
Chloride: 102 mmol/L (ref 98–111)
Creatinine, Ser: 0.78 mg/dL (ref 0.44–1.00)
GFR, Estimated: >60 mL/min (ref >60)
Glucose, Bld: 94 mg/dL (ref 70–99)
Potassium: 5.2 mmol/L — ABNORMAL HIGH (ref 3.5–5.1)
Sodium: 137 mmol/L (ref 135–145)

## 2021-02-23 LAB — TROPONIN I (HIGH SENSITIVITY)
Troponin I (High Sensitivity): 283 ng/L (ref <18)
Troponin I (High Sensitivity): 578 ng/L (ref <18)

## 2021-02-23 MED ORDER — NITROGLYCERIN 0.4 MG SL SUBL: 0.4000 mg | SUBLINGUAL_TABLET | SUBLINGUAL | Status: DC | PRN

## 2021-02-23 MED ORDER — METOPROLOL TARTRATE 25 MG PO TABS
25.0000 mg | ORAL_TABLET | Freq: Two times a day (BID) | ORAL | Status: DC
  Administered 2021-02-24 – 2021-02-25 (×3): 25 mg via ORAL
  Filled 2021-02-23 (×4): qty 1

## 2021-02-23 MED ORDER — LATANOPROST 0.005 % OP SOLN
1.0000 [drp] | Freq: Every day | OPHTHALMIC | Status: DC
  Administered 2021-02-24: 1 [drp] via OPHTHALMIC
  Filled 2021-02-23 (×2): qty 2.5

## 2021-02-23 MED ORDER — HYDRALAZINE HCL 20 MG/ML IJ SOLN: 10.0000 mg | Freq: Four times a day (QID) | INTRAMUSCULAR | Status: DC | PRN

## 2021-02-23 MED ORDER — ACETAMINOPHEN 325 MG PO TABS
650.0000 mg | ORAL_TABLET | Freq: Four times a day (QID) | ORAL | Status: DC | PRN
  Filled 2021-02-23 (×2): qty 2

## 2021-02-23 MED ORDER — ASPIRIN 81 MG PO CHEW
324.0000 mg | CHEWABLE_TABLET | ORAL | Status: AC
  Administered 2021-02-23: 324 mg via ORAL
  Filled 2021-02-23: qty 4

## 2021-02-23 MED ORDER — MORPHINE SULFATE (PF) 4 MG/ML IV SOLN
4.0000 mg | Freq: Once | INTRAVENOUS | Status: AC
  Administered 2021-02-23: 4 mg via INTRAVENOUS
  Filled 2021-02-23: qty 1

## 2021-02-23 MED ORDER — NITROGLYCERIN IN D5W 200-5 MCG/ML-% IV SOLN
2.0000 ug/min | INTRAVENOUS | Status: DC
  Administered 2021-02-23: 5 ug/min via INTRAVENOUS
  Filled 2021-02-23: qty 250

## 2021-02-23 MED ORDER — ONDANSETRON HCL 4 MG/2ML IJ SOLN: 4.0000 mg | Freq: Four times a day (QID) | INTRAMUSCULAR | Status: DC | PRN

## 2021-02-23 MED ORDER — ASPIRIN 300 MG RE SUPP: 300.0000 mg | RECTAL | Status: AC

## 2021-02-23 MED ORDER — ASPIRIN EC 81 MG PO TBEC: 81.0000 mg | DELAYED_RELEASE_TABLET | Freq: Every day | ORAL | Status: DC

## 2021-02-23 MED ORDER — ASPIRIN EC 81 MG PO TBEC
81.0000 mg | DELAYED_RELEASE_TABLET | Freq: Every day | ORAL | Status: DC
  Administered 2021-02-24: 81 mg via ORAL
  Filled 2021-02-23 (×2): qty 1

## 2021-02-23 MED ORDER — ATORVASTATIN CALCIUM 40 MG PO TABS
40.0000 mg | ORAL_TABLET | Freq: Every day | ORAL | Status: DC
  Administered 2021-02-24 (×2): 40 mg via ORAL
  Filled 2021-02-23 (×2): qty 1

## 2021-02-23 MED ORDER — HEPARIN (PORCINE) 25000 UT/250ML-% IV SOLN
650.0000 [IU]/h | INTRAVENOUS | Status: DC
  Administered 2021-02-23: 550 [IU]/h via INTRAVENOUS
  Filled 2021-02-23: qty 250

## 2021-02-23 MED ORDER — ACETAMINOPHEN 325 MG PO TABS: 650.0000 mg | ORAL_TABLET | ORAL | Status: DC | PRN

## 2021-02-23 MED ORDER — ONDANSETRON HCL 4 MG/2ML IJ SOLN
4.0000 mg | Freq: Once | INTRAMUSCULAR | Status: AC
  Administered 2021-02-23: 4 mg via INTRAVENOUS
  Filled 2021-02-23: qty 2

## 2021-02-23 MED ORDER — AMLODIPINE BESYLATE 10 MG PO TABS
10.0000 mg | ORAL_TABLET | Freq: Every day | ORAL | Status: DC
  Administered 2021-02-24: 10 mg via ORAL
  Filled 2021-02-23 (×2): qty 2
  Filled 2021-02-23: qty 1

## 2021-02-23 MED ORDER — DORZOLAMIDE HCL-TIMOLOL MAL 2-0.5 % OP SOLN
1.0000 [drp] | Freq: Two times a day (BID) | OPHTHALMIC | Status: DC
  Administered 2021-02-24 – 2021-02-25 (×2): 1 [drp] via OPHTHALMIC
  Filled 2021-02-23 (×2): qty 10

## 2021-02-23 NOTE — ED Provider Notes (Signed)
.  Emergency Medicine Provider Triage Evaluation Note  Joanne Hayden , a 80 y.o. female  was evaluated in triage.  Brought in by EMS, pt complains of 7-8 left-sided chest pain onset prior to arrival.  She was at her cardiologist appointment this morning when she began to feel unwell.  Her left-sided chest pain radiates to her left shoulder and left-sided neck.  EKG obtained at cardiologist office showed A. fib with a rate to 130.  While with EMS she converted to normal sinus rhythm with a rate of 90s.  Patient has a history of MI in 2020, A. fib.  Patient is on Eliquis. She denies shortness of breath, abdominal pain, nausea, vomiting.   Review of Systems  Positive: Chest pain Negative: Shortness of breath  Physical Exam  BP 130/79 (BP Location: Left Arm)   Pulse 86   Temp 98.3 F (36.8 C)   Resp 18   SpO2 100%  Gen:   Awake, no distress   Resp:  Normal effort  MSK:   Moves extremities without difficulty  Other:   RRR  Medical Decision Making  Medically screening exam initiated at 12:03 PM.  Appropriate orders placed.  Joanne Hayden was informed that the remainder of the evaluation will be completed by another provider, this initial triage assessment does not replace that evaluation, and the importance of remaining in the ED until their evaluation is complete.    Joanne Hayden A, PA-C 02/23/21 1203    Joanne Bale, MD 02/23/21 1734

## 2021-02-23 NOTE — H&P (Signed)
Joanne Hayden is an 80 y.o. female.   Chief Complaint: Chest pain HPI:   Joanne Hayden is 80 y.o. AA female with hypertension, hyperlipidemia, type 2 diabetes mellitus, CAD s/p PCI, PAF, multiple sclerosis, primary open-angle glaucoma.   Since her las visit with me, I received more information re: her prior cardiac history.    2020: CAD: NSTEMI, LAD (2.75X18 mm & 3.0X12 mm Xience pLAD) and Lcx (2.5X28 mm & 2.75X33 mm Xience) PCI PAF On Eliquis and plavix then Normal LVEF, Mild AI, mild TR   Patient came for regular visit today.  In the process, she mentioned retrosternal chest discomfort, as well as tingling down her left arm.  EKG showed A. fib with RVR around 130 bpm. I sent her to ER with 911. She self converted to sinus rhythm. However, continued to have chest pain. HS trop elevated 283-->578.   Past Medical History:  Diagnosis Date   CHF (congestive heart failure) (HCC)    Diabetes mellitus without complication ([place])    Heart attack ([place])    Hyperlipidemia    Hypertension    Multiple sclerosis ([place])     Past Surgical History:  Procedure Laterality Date   HIP SURGERY     HYSTEROTOMY       Family History  Problem Relation Age of Onset   Hypertension Sister     Social History:  reports that she has quit smoking. Her smoking use included cigarettes. She has a 0.13 pack-year smoking history. She has never used smokeless tobacco. She reports current alcohol use. She reports that she does not use drugs.  Allergies:  Allergies  Allergen Reactions   Codeine     Other reaction(s): Unknown Other reaction(s): Unknown   Naloxone Other (See Comments)    Other reaction(s): Unknown Other reaction(s): Unknown Other reaction(s): Unknown   Nitrofurantoin     Other reaction(s): Unknown Other reaction(s): Unknown   Sulfa Antibiotics     Other reaction(s): Unknown Other reaction(s): Unknown    Review of Systems  Constitutional: Negative for decreased appetite,  malaise/fatigue, weight gain and weight loss.  HENT:  Negative for congestion.   Eyes:  Negative for visual disturbance.  Cardiovascular:  Positive for chest pain. Negative for dyspnea on exertion, leg swelling, palpitations and syncope.  Respiratory:  Negative for cough.   Endocrine: Negative for cold intolerance.  Hematologic/Lymphatic: Does not bruise/bleed easily.  Skin:  Negative for itching and rash.  Musculoskeletal:  Negative for myalgias.  Gastrointestinal:  Negative for abdominal pain, nausea and vomiting.  Genitourinary:  Negative for dysuria.  Neurological:  Positive for weakness (Lower ext>upper ext, 2/2 due to MS). Negative for dizziness.  Psychiatric/Behavioral:  The patient is not nervous/anxious.   All other systems reviewed and are negative.   Blood pressure (!) 138/92, pulse 64, temperature 98.3 F (36.8 C), resp. rate 17, height 5\' 3"  (1.6 m), weight 45.4 kg, SpO2 98 %. Body mass index is 17.73 kg/m.  Physical Exam Vitals and nursing note reviewed.  Constitutional:      General: She is not in acute distress.    Appearance: She is well-developed.  HENT:     Head: Normocephalic and atraumatic.  Eyes:     Conjunctiva/sclera: Conjunctivae normal.     Pupils: Pupils are equal, round, and reactive to light.  Neck:     Vascular: No JVD.  Cardiovascular:     Rate and Rhythm: Normal rate and regular rhythm.     Pulses: Normal pulses and intact distal pulses.  Heart sounds: No murmur heard. Pulmonary:     Effort: Pulmonary effort is normal.     Breath sounds: Normal breath sounds. No wheezing or rales.  Abdominal:     General: Bowel sounds are normal.     Palpations: Abdomen is soft.     Tenderness: There is no rebound.  Musculoskeletal:        General: No tenderness. Normal range of motion.     Right lower leg: No edema.  Lymphadenopathy:     Cervical: No cervical adenopathy.  Skin:    General: Skin is warm and dry.  Neurological:     Mental Status:  She is alert and oriented to person, place, and time.     Cranial Nerves: No cranial nerve deficit.     Comments: LE weakness, due to MS. Baseline    Results for orders placed or performed during the hospital encounter of 02/23/21 (from the past 48 hour(s))  Basic metabolic panel     Status: Abnormal   Collection Time: 02/23/21 11:50 AM  Result Value Ref Range   Sodium 137 135 - 145 mmol/L   Potassium 5.2 (H) 3.5 - 5.1 mmol/L   Chloride 102 98 - 111 mmol/L   CO2 28 22 - 32 mmol/L   Glucose, Bld 94 70 - 99 mg/dL    Comment: Glucose reference range applies only to samples taken after fasting for at least 8 hours.   BUN 16 8 - 23 mg/dL   Creatinine, Ser 0.78 0.44 - 1.00 mg/dL   Calcium 10.1 8.9 - 10.3 mg/dL   GFR, Estimated >60 >60 mL/min    Comment: (NOTE) Calculated using the CKD-EPI Creatinine Equation (2021)    Anion gap 7 5 - 15    Comment: Performed at [facility] [address], [place], [place] [address]  CBC     Status: None   Collection Time: 02/23/21 11:50 AM  Result Value Ref Range   WBC 4.7 4.0 - 10.5 K/uL   RBC 4.48 3.87 - 5.11 MIL/uL   Hemoglobin 13.3 12.0 - 15.0 g/dL   HCT 42.4 36.0 - 46.0 %   MCV 94.6 80.0 - 100.0 fL   MCH 29.7 26.0 - 34.0 pg   MCHC 31.4 30.0 - 36.0 g/dL   RDW 15.0 11.5 - 15.5 %   Platelets 182 150 - 400 K/uL   nRBC 0.0 0.0 - 0.2 %    Comment: Performed at [facility], [place] [address], [place], [place] [address]  Troponin I (High Sensitivity)     Status: Abnormal   Collection Time: 02/23/21 11:50 AM  Result Value Ref Range   Troponin I (High Sensitivity) 283 (HH) <18 ng/L    Comment: CRITICAL RESULT CALLED TO, READ BACK BY AND VERIFIED WITH: L MEEKS RN BY SSTEPHENS [id number] (NOTE) Elevated high sensitivity troponin I (hsTnI) values and significant  changes across serial measurements may suggest ACS but many other  chronic and acute conditions are known to elevate hsTnI results.  Refer to the Links section for chest pain  algorithms and additional  guidance. Performed at [facility], [place] [address], [place], [place] [address]   Troponin I (High Sensitivity)     Status: Abnormal   Collection Time: 02/23/21  1:39 PM  Result Value Ref Range   Troponin I (High Sensitivity) 578 (HH) <18 ng/L    Comment: CRITICAL VALUE NOTED.  VALUE IS CONSISTENT WITH PREVIOUSLY REPORTED AND CALLED VALUE. (NOTE) Elevated  high sensitivity troponin I (hsTnI) values and significant  changes across serial measurements may suggest ACS but many other  chronic and acute conditions are known to elevate hsTnI results.  Refer to the Links section for chest pain algorithms and additional  guidance. Performed at [facility], [address] [place], [place] [address]     Labs:   Lab Results  Component Value Date   WBC 4.7 02/23/2021   HGB 13.3 02/23/2021   HCT 42.4 02/23/2021   MCV 94.6 02/23/2021   PLT 182 02/23/2021    Recent Labs  Lab 02/23/21 1150  NA 137  K 5.2*  CL 102  CO2 28  BUN 16  CREATININE 0.78  CALCIUM 10.1  GLUCOSE 94    Lipid Panel  No results found for: CHOL, TRIG, HDL, CHOLHDL, VLDL, LDLCALC  BNP (last 3 results) No results for input(s): BNP in the last 8760 hours.  HEMOGLOBIN A1C No results found for: HGBA1C, MPG  Cardiac Panel (last 3 results) No results for input(s): CKTOTAL, CKMB, RELINDX in the last 8760 hours.  Invalid input(s): TROPONINHS  No results found for: CKTOTAL, CKMB, CKMBINDEX   TSH No results for input(s): TSH in the last 8760 hours.   (Not in a hospital admission)     Current Facility-Administered Medications:    acetaminophen (TYLENOL) tablet 650 mg, 650 mg, Oral, Q6H PRN, Reata Petrov J, MD   amLODipine (NORVASC) tablet 10 mg, 10 mg, Oral, Daily, Misha Vanoverbeke J, MD   [START ON 02/24/2021] aspirin EC tablet 81 mg, 81 mg, Oral, Daily, Zymir Napoli J, MD   atorvastatin (LIPITOR) tablet 40 mg, 40 mg, Oral, QHS, Antolin Belsito  J, MD   dorzolamide-timolol (COSOPT) 22.3-6.8 MG/ML ophthalmic solution 1 drop, 1 drop, Both Eyes, BID, Davidjames Blansett J, MD   heparin ADULT infusion 100 units/mL (25000 units/293mL), 550 Units/hr, Intravenous, Continuous, Toriano Aikey J, MD, Last Rate: 5.5 mL/hr at 02/23/21 1818, 550 Units/hr at 02/23/21 1818   hydrALAZINE (APRESOLINE) injection 10 mg, 10 mg, Intravenous, Q6H PRN, Bridgette Wolden J, MD   latanoprost (XALATAN) 0.005 % ophthalmic solution 1 drop, 1 drop, Both Eyes, QHS, Truett Mcfarlan J, MD   metoprolol tartrate (LOPRESSOR) tablet 25 mg, 25 mg, Oral, BID, Dvon Jiles J, MD   nitroGLYCERIN (NITROSTAT) SL tablet 0.4 mg, 0.4 mg, Sublingual, Q5 min PRN, MacPherson, Jett, MD   nitroGLYCERIN 50 mg in dextrose 5 % 250 mL (0.2 mg/mL) infusion, 2-200 mcg/min, Intravenous, Titrated, Koa Zoeller J, MD, Last Rate: 3 mL/hr at 02/23/21 1823, 10 mcg/min at 02/23/21 1823   ondansetron (ZOFRAN) injection 4 mg, 4 mg, Intravenous, Q6H PRN, Syerra Abdelrahman J, MD  Current Outpatient Medications:    acetaminophen (TYLENOL) 325 MG tablet, Take 650 mg by mouth every 6 (six) hours as needed., Disp: , Rfl:    aspirin EC 81 MG tablet, Take 81 mg by mouth daily. Swallow whole., Disp: , Rfl:    atorvastatin (LIPITOR) 40 MG tablet, Take 40 mg by mouth at bedtime., Disp: , Rfl:    Continuous Blood Gluc Receiver (FREESTYLE LIBRE 14 DAY READER) DEVI, See admin instructions., Disp: , Rfl:    Continuous Blood Gluc Sensor (FREESTYLE LIBRE 14 DAY SENSOR) MISC, See admin instructions., Disp: , Rfl:    dorzolamide-timolol (COSOPT) 22.3-6.8 MG/ML ophthalmic solution, 1 drop 2 (two) times daily., Disp: , Rfl:    ELIQUIS 2.5 MG TABS tablet, Take 2.5 mg by mouth 2 (two) times daily., Disp: , Rfl:    latanoprost (  XALATAN) 0.005 % ophthalmic solution, 1 drop at bedtime., Disp: , Rfl:    lisinopril (ZESTRIL) 10 MG tablet, Take 1 tablet (10 mg total) by mouth daily., Disp: 90 tablet, Rfl: 1    Netarsudil Dimesylate (RHOPRESSA) 0.02 % SOLN, 1 drop into affected eye in the evening, Disp: , Rfl:    Today's Vitals   02/23/21 1745 02/23/21 1800 02/23/21 1819 02/23/21 1821  BP: (!) 169/85 (!) 167/84 (!) 138/92   Pulse: 65 67 64   Resp: (!) 21 (!) 21 17   Temp:      SpO2: 100% 99% 98%   Weight:      Height:      PainSc:    3    Body mass index is 17.73 kg/m.   CARDIAC STUDIES:  EKG 02/23/2021: Sinus rhythm Nonspecific ST-T changes  EKG 02/23/2021: Atrial fibrillation 128 bpm Low voltage in precordial leads Anterior infarct -age undetermined Negative precordial T-waves  -May be normal -consider anteroseptal ischemia   Lower Extremity Venous Duplex (bilateral) 02/15/2020:  No evidence of deep vein thrombosis of the lower extremities with normal  venous return.   VQ scan 02/12/2020: No evidence acute pulmonary embolism.   Lexiscan Tetrofosmin Stress Test  01/09/2020: Nondiagnostic ECG stress. Resting EKG/ECG demonstrated normal sinus rhythm. Observed anteroseptal infarct. No ST-T wave abnormalities.  Peak EKG/ECG revealed no ST-T wave abnormalities. Rare PACs and PVCs.  Myocardial perfusion is normal. Although the TID ratio 1.47 is abnormal, visually I do not agree with the findings and LV appears to be normal/small sized in both rest and stress images.  LV end-diastolic volume was 50 mL. Overall LV systolic function is normal without regional wall motion abnormalities. Stress LV EF: 61%.  No previous exam available for comparison. Low risk.    Echocardiogram 12/20/2019:  Left ventricle cavity is normal in size. Moderate concentric hypertrophy  of the left ventricle. Normal global wall motion. Normal LV systolic  function with EF 55%. Doppler evidence of grade I (impaired) diastolic  dysfunction, normal LAP.  Left atrial cavity is slightly dilated. Aneurysmal interatrial septum  without 2D or color Doppler evidence of interatrial shunt.  Right atrial cavity is  moderately dilated.  Trileaflet aortic valve.  Moderate (Grade II) aortic regurgitation.  Moderate (Grade II) mitral regurgitation.  Severe tricuspid regurgitation. Estimated pulmonary artery systolic  pressure 25 mmHg.    Assessment/Plan  80 y.o. African American female with hypertension, hyperlipidemia, type 2 diabetes mellitus, CAD s/p PCI, PAF, multiple sclerosis, primary open-angle glaucoma, currently in Afib RVR w/angina   CAD: NSTEMI Recommend IV heparin, aspirin, statin. Added metoprolol tartrate 25 mg bid, amlodipine 10 mg daily. Started nitroglycerin drip. Hold lisinopril. LAD and Lcx PCI for NSTEMI (2020)   PAF: CHA2DS2VASc score 4, annual stroke risk 5% On Eliquis at baseline.  Took a dose at 6 AM this morning.   Hold eliquis for now. Self converted to sinus rhythm Will consider AAD outpatient-[place], if type 1 MI excluded by cath   Nigel Mormon, MD Pager: [phone] Office: [phone]

## 2021-02-23 NOTE — ED Provider Notes (Incomplete)
Patient is an 80 year old female coming in after seeing her cardiologist today because she was having chest pain that radiated into her left arm and she was generally feeling unwell.  In the office she was found to be in A. fib RVR with hypotension.  Upon arrival here patient has converted to a sinus rhythm.  She is still having some mild discomfort in the center of her chest but reports no pain in her left arm.  She is well-appearing at this time and blood pressure is now elevated.  We will try nitroglycerin.  She is already received aspirin.  Patient's troponins are climbing.  Otherwise labs are okay.  Spoke with her cardiologist Dr. Rosemary Holms who plans on admitting the patient.  She does take Eliquis so we will hold heparin until he evaluates the patient.

## 2021-02-23 NOTE — ED Provider Notes (Signed)
[facility] EMERGENCY DEPARTMENT Provider Note   CSN: [id number] Arrival date & time: 02/23/21  1133     History No chief complaint on file.   Joanne Hayden is a 80 y.o. female.  The history is provided by the patient and medical records.  Chest Pain Pain location:  Substernal area Pain quality: pressure   Pain radiates to:  L arm Pain severity:  Moderate Duration:  2 days Timing:  Intermittent Progression:  Worsening Chronicity:  New Context: at rest   Relieved by:  Nothing Worsened by:  Nothing Ineffective treatments:  Aspirin Associated symptoms: no abdominal pain, no back pain, no cough, no fever, no palpitations, no shortness of breath and no vomiting    80 y.o. AA female with hypertension, hyperlipidemia, type 2 diabetes mellitus, CAD s/p PCI, PAF, multiple sclerosis, primary open-angle glaucoma.     Past Medical History:  Diagnosis Date   CHF (congestive heart failure) ([place])    Diabetes mellitus without complication ([place])    Heart attack ([place])    Hyperlipidemia    Hypertension    Multiple sclerosis ([place])     Patient Active Problem List   Diagnosis Date Noted   PAF (paroxysmal atrial fibrillation) ([place]) 02/23/2021   Atrial fibrillation with rapid ventricular response ([place]) 02/23/2021   NSTEMI (non-ST elevated myocardial infarction) ([place]) 02/23/2021   Decreased estrogen level 12/03/2020   Abnormal gait 06/03/2020   Dyslipidemia 06/03/2020   Malnutrition of moderate degree ([place]: 60% to less than 75% of standard weight) ([place]) 06/03/2020   Multiple sclerosis ([place]) 06/03/2020   Primary open angle glaucoma (POAG) of both eyes, indeterminate stage 06/03/2020   Type 2 diabetes mellitus with other specified complication ([place]) 99991111   Nonrheumatic tricuspid valve regurgitation 02/27/2020   Essential hypertension 11/28/2019   Coronary artery disease involving native coronary artery of native heart without angina pectoris 11/28/2019    History of syncope 11/28/2019    Past Surgical History:  Procedure Laterality Date   HIP SURGERY     HYSTEROTOMY       OB History   No obstetric history on file.     Family History  Problem Relation Age of Onset   Hypertension Sister     Social History   Tobacco Use   Smoking status: Former    Packs/day: 0.25    Years: 0.50    Pack years: 0.13    Types: Cigarettes   Smokeless tobacco: Never  Vaping Use   Vaping Use: Never used  Substance Use Topics   Alcohol use: Yes    Comment: occ   Drug use: Never    Home Medications Prior to Admission medications   Medication Sig Start Date End Date Taking? Authorizing Provider  acetaminophen (TYLENOL) 325 MG tablet Take 325 mg by mouth every 6 (six) hours as needed for mild pain or headache.   Yes [provider]  aspirin EC 81 MG tablet Take 81 mg by mouth at bedtime. Swallow whole.   Yes [provider]  atorvastatin (LIPITOR) 40 MG tablet Take 40 mg by mouth at bedtime. 11/14/19  Yes [provider]  Continuous Blood Gluc Sensor (FREESTYLE LIBRE 2 SENSOR) MISC Inject 1 Device into the skin every 14 (fourteen) days.   Yes [provider]  dorzolamide-timolol (COSOPT) 22.3-6.8 MG/ML ophthalmic solution Place 1 drop into both eyes in the morning and at bedtime. 11/14/19  Yes [provider]  ELIQUIS 2.5 MG TABS tablet Take 2.5 mg by mouth 2 (  two) times daily. 11/14/19  Yes [provider]  latanoprost (XALATAN) 0.005 % ophthalmic solution Place 1 drop into both eyes at bedtime. 11/14/19  Yes [provider]  lisinopril (ZESTRIL) 10 MG tablet Take 1 tablet (10 mg total) by mouth daily. Patient taking differently: Take 10 mg by mouth in the morning and at bedtime. 12/17/19  Yes Patwardhan, Anabel Bene, MD  Continuous Blood Gluc Receiver (FREESTYLE LIBRE 14 DAY READER) DEVI See admin instructions. 09/29/20   [provider]  Netarsudil Dimesylate (RHOPRESSA) 0.02 % SOLN  1 drop into affected eye in the evening Patient not taking: Reported on 02/23/2021    [provider]    Allergies    Codeine, Gabapentin, Naloxone, Nitrofurantoin, Norco [hydrocodone-acetaminophen], and Sulfa antibiotics  Review of Systems   Review of Systems  Constitutional:  Negative for chills and fever.  HENT:  Negative for ear pain and sore throat.   Eyes:  Negative for pain and visual disturbance.  Respiratory:  Negative for cough and shortness of breath.   Cardiovascular:  Positive for chest pain. Negative for palpitations.  Gastrointestinal:  Negative for abdominal pain and vomiting.  Genitourinary:  Negative for dysuria and hematuria.  Musculoskeletal:  Negative for arthralgias and back pain.  Skin:  Negative for color change and rash.  Neurological:  Negative for seizures and syncope.  All other systems reviewed and are negative.  Physical Exam Updated Vital Signs BP (!) 138/92   Pulse 64   Temp 98.3 F (36.8 C)   Resp 17   Ht 5\' 3"  (1.6 m)   Wt 45.4 kg   SpO2 98%   BMI 17.73 kg/m   Physical Exam Vitals and nursing note reviewed.  Constitutional:      General: She is not in acute distress.    Appearance: She is well-developed.  HENT:     Head: Normocephalic and atraumatic.     Right Ear: External ear normal.     Left Ear: External ear normal.     Nose: Nose normal. No congestion.     Mouth/Throat:     Mouth: Mucous membranes are moist.     Pharynx: Oropharynx is clear. No oropharyngeal exudate.  Eyes:     Conjunctiva/sclera: Conjunctivae normal.  Cardiovascular:     Rate and Rhythm: Normal rate and regular rhythm.     Pulses: Normal pulses.     Heart sounds: No murmur heard. Pulmonary:     Effort: Pulmonary effort is normal. No respiratory distress.     Breath sounds: Normal breath sounds. No wheezing, rhonchi or rales.  Abdominal:     General: Abdomen is flat. Bowel sounds are normal.     Palpations: Abdomen is soft.     Tenderness:  There is no abdominal tenderness. There is no guarding or rebound.  Musculoskeletal:        General: No swelling.     Cervical back: Normal range of motion and neck supple.     Right lower leg: No edema.     Left lower leg: No edema.  Skin:    General: Skin is warm and dry.     Capillary Refill: Capillary refill takes less than 2 seconds.     Findings: No rash.  Neurological:     General: No focal deficit present.     Mental Status: She is alert and oriented to person, place, and time.  Psychiatric:        Mood and Affect: Mood normal.    ED  Results / Procedures / Treatments   Labs (all labs ordered are listed, but only abnormal results are displayed) Labs Reviewed  BASIC METABOLIC PANEL - Abnormal; Notable for the following components:      Result Value   Potassium 5.2 (*)    All other components within normal limits  TROPONIN I (HIGH SENSITIVITY) - Abnormal; Notable for the following components:   Troponin I (High Sensitivity) 283 (*)    All other components within normal limits  TROPONIN I (HIGH SENSITIVITY) - Abnormal; Notable for the following components:   Troponin I (High Sensitivity) 578 (*)    All other components within normal limits  CBC  HEPARIN LEVEL (UNFRACTIONATED)  APTT  HEPARIN LEVEL (UNFRACTIONATED)  BASIC METABOLIC PANEL  CBC  TROPONIN I (HIGH SENSITIVITY)    EKG EKG Interpretation  Date/Time:  Monday February 23 2021 11:49:06 EST Ventricular Rate:  86 PR Interval:  138 QRS Duration: 68 QT Interval:  350 QTC Calculation: 418 R Axis:   47 Text Interpretation: Normal sinus rhythm Low voltage QRS Cannot rule out Anterior infarct , age undetermined No previous tracing Confirmed by Blanchie Dessert [phone]) on 02/23/2021 3:44:22 PM  Radiology DG Chest 2 View  Result Date: 02/23/2021 CLINICAL DATA:  Chest pain EXAM: CHEST - 2 VIEW COMPARISON:  02/12/2020 FINDINGS: Mild cardiomegaly. Lungs clear. No effusions or edema. No acute bony abnormality.  IMPRESSION: Cardiomegaly.  No active disease. Electronically Signed   By: Rolm Baptise M.D.   On: 02/23/2021 12:23    Procedures Procedures   Medications Ordered in ED Medications  nitroGLYCERIN (NITROSTAT) SL tablet 0.4 mg (has no administration in time range)  ondansetron (ZOFRAN) injection 4 mg (has no administration in time range)  nitroGLYCERIN 50 mg in dextrose 5 % 250 mL (0.2 mg/mL) infusion (10 mcg/min Intravenous Rate/Dose Change 02/23/21 1823)  heparin ADULT infusion 100 units/mL (25000 units/286mL) (550 Units/hr Intravenous New Bag/Given 02/23/21 1818)  acetaminophen (TYLENOL) tablet 650 mg (has no administration in time range)  aspirin EC tablet 81 mg (has no administration in time range)  atorvastatin (LIPITOR) tablet 40 mg (has no administration in time range)  amLODipine (NORVASC) tablet 10 mg (has no administration in time range)  latanoprost (XALATAN) 0.005 % ophthalmic solution 1 drop (has no administration in time range)  dorzolamide-timolol (COSOPT) 22.3-6.8 MG/ML ophthalmic solution 1 drop (has no administration in time range)  hydrALAZINE (APRESOLINE) injection 10 mg (has no administration in time range)  metoprolol tartrate (LOPRESSOR) tablet 25 mg (has no administration in time range)  morphine 4 MG/ML injection 4 mg (4 mg Intravenous Given 02/23/21 1805)  ondansetron (ZOFRAN) injection 4 mg (4 mg Intravenous Given 02/23/21 1805)  aspirin chewable tablet 324 mg (324 mg Oral Given 02/23/21 1808)    Or  aspirin suppository 300 mg ( Rectal See Alternative 02/23/21 1808)    ED Course  I have reviewed the triage vital signs and the nursing notes.  Pertinent labs & imaging results that were available during my care of the patient were reviewed by me and considered in my medical decision making (see chart for details).    MDM Rules/Calculators/A&P                          80 y/o female with PMHx sig for CAD s/p PCI 2020 Presented w/ chest pain radiating down L arm  concerning for ACS Afib w/ RVR at cardiologist office prior to arrival, spontaneously converted to NSR on arrival to  the ED Trops 283-->578, tropinemia may be related to acs vs demand ischemia in the setting of tachyarrhythmia, will continue to trend EKG showed nonspecific T wave changes inferiorly, no ST elevations or depressions Not tachypneic, hypoxic, or describing pleuritic chest pain. On AC. Low concern for PE.  No tearing pain to back, not significantly hypertensive, no pulse deficits, low concerns for dissection Discussed w/ cards, no indications for heparin gtt at this time, coags and delta trops pending, will admit for further cardiac evaluation Pt given ntg and morphine, received asa prior to arrival, pain controlled Medicine contacted for admission, hand off given   Final Clinical Impression(s) / ED Diagnoses Final diagnoses:  Chest pain, unspecified type  NSTEMI (non-ST elevated myocardial infarction) [facility])    Rx / [place] Orders ED Discharge Orders     None        Idamae Lusher, MD 02/23/21 2148    Blanchie Dessert, MD 02/24/21 2251

## 2021-02-23 NOTE — Progress Notes (Signed)
Patient referred by [name] for coronary artery disease with history of MI.   Subjective:   Joanne Hayden, female    DOB: 06-11-1940, 80 y.o.   MRN: [id number]   Chief Complaint  Patient presents with   Coronary Artery Disease   Hypertension    HPI   Joanne Hayden is 80 y.o. AA female with hypertension, hyperlipidemia, type 2 diabetes mellitus, CAD s/p PCI, PAF, multiple sclerosis, primary open-angle glaucoma.  Since her las visit with me, I received more information re: her prior cardiac history.   2020: CAD: NSTEMI, LAD (2.75X18 mm & 3.0X12 mm Xience pLAD) and Lcx (2.5X28 mm & 2.75X33 mm Xience) PCI PAF On Eliquis and plavix then Normal LVEF, Mild AI, mild TR  Patient came for regular visit today.  In the process, she mentioned retrosternal chest discomfort, as well as tingling down her left arm.  EKG showed A. fib with RVR around 130 bpm.  Patient has MS, ambulates in wheelchair.  Today, she had a transportation service Doppler to her office visit.  She does not have any family immediately available.  Son, Jojo, was available over the phone and is aware.   Current Outpatient Medications on File Prior to Visit  Medication Sig Dispense Refill   acetaminophen (TYLENOL) 325 MG tablet Take 650 mg by mouth every 6 (six) hours as needed.     aspirin EC 81 MG tablet Take 81 mg by mouth daily. Swallow whole.     atorvastatin (LIPITOR) 40 MG tablet Take 40 mg by mouth at bedtime.     Continuous Blood Gluc Receiver (FREESTYLE LIBRE 14 DAY READER) DEVI See admin instructions.     Continuous Blood Gluc Sensor (FREESTYLE LIBRE 14 DAY SENSOR) MISC See admin instructions.     dorzolamide-timolol (COSOPT) 22.3-6.8 MG/ML ophthalmic solution 1 drop 2 (two) times daily.     ELIQUIS 2.5 MG TABS tablet Take 2.5 mg by mouth 2 (two) times daily.     gabapentin (NEURONTIN) 100 MG capsule Take 1 capsule (100 mg total) by mouth at bedtime. 90 capsule 1   latanoprost (XALATAN)  0.005 % ophthalmic solution 1 drop at bedtime.     lisinopril (ZESTRIL) 10 MG tablet Take 1 tablet (10 mg total) by mouth daily. 90 tablet 1   Netarsudil Dimesylate (RHOPRESSA) 0.02 % SOLN 1 drop into affected eye in the evening     No current facility-administered medications on file prior to visit.    Cardiovascular and other pertinent studies:  EKG 02/23/2021: Atrial fibrillation 128 bpm Low voltage in precordial leads Anterior infarct -age undetermined Negative precordial T-waves  -May be normal -consider anteroseptal ischemia  Lower Extremity Venous Duplex (bilateral) 02/15/2020:  No evidence of deep vein thrombosis of the lower extremities with normal  venous return.  VQ scan 02/12/2020: No evidence acute pulmonary embolism.  Lexiscan Tetrofosmin Stress Test  01/09/2020: Nondiagnostic ECG stress. Resting EKG/ECG demonstrated normal sinus rhythm. Observed anteroseptal infarct. No ST-T wave abnormalities.  Peak EKG/ECG revealed no ST-T wave abnormalities. Rare PACs and PVCs.  Myocardial perfusion is normal. Although the TID ratio 1.47 is abnormal, visually I do not agree with the findings and LV appears to be normal/small sized in both rest and stress images.  LV end-diastolic volume was 50 mL. Overall LV systolic function is normal without regional wall motion abnormalities. Stress LV EF: 61%.  No previous exam available for comparison. Low risk.   Echocardiogram 12/20/2019:  Left ventricle cavity is normal in size. Moderate  concentric hypertrophy  of the left ventricle. Normal global wall motion. Normal LV systolic  function with EF 55%. Doppler evidence of grade I (impaired) diastolic  dysfunction, normal LAP.  Left atrial cavity is slightly dilated. Aneurysmal interatrial septum  without 2D or color Doppler evidence of interatrial shunt.  Right atrial cavity is moderately dilated.  Trileaflet aortic valve.  Moderate (Grade II) aortic regurgitation.  Moderate (Grade II)  mitral regurgitation.  Severe tricuspid regurgitation. Estimated pulmonary artery systolic  pressure 25 mmHg.  Recent labs: 02/04/2021: Glucose 81, BUN/Cr 16/0.5. EGFR 92. HbA1C 6.5% Chol 102, TG 33, HDL 52, LDL 41 TSH N/A  10/15/2019: Glucose 93, BUN/Cr 13/0.65. EGFR 88. Na/K 141/4.8. Rest of the CMP normal H/H 13/39. MCV 91. Platelets 147 Chol 96, TG 32, HDL 51, LDL 36 No TSH or A1C available.   Review of Systems  Cardiovascular:  Positive for chest pain. Negative for claudication, dyspnea on exertion, leg swelling, orthopnea, palpitations and syncope.  Respiratory:  Negative for shortness of breath.   Musculoskeletal:  Positive for muscle weakness (Chronic).       Pain and tingling down left arm  Gastrointestinal:  Negative for melena.  Neurological:  Positive for weakness (Chronic). Negative for dizziness, light-headedness and numbness.       Vitals:   02/23/21 1010  BP: (!) 83/47  Pulse: (!) 102  Resp: 16  Temp: 98 F (36.7 C)  SpO2: 97%     Body mass index is 17.71 kg/m.   Objective:   Physical Exam Constitutional:      Appearance: She is normal weight.     Comments: In wheelchair   Cardiovascular:     Rate and Rhythm: Normal rate and regular rhythm.     Pulses:          Radial pulses are 2+ on the right side and 2+ on the left side.       Dorsalis pedis pulses are 1+ on the right side and 1+ on the left side.       Posterior tibial pulses are 1+ on the right side and 1+ on the left side.     Heart sounds: Murmur heard.  Systolic murmur is present with a grade of 3/6 at the upper right sternal border.  Pulmonary:     Effort: Pulmonary effort is normal.     Breath sounds: Normal breath sounds.  Musculoskeletal:     Right lower leg: No edema.     Left lower leg: No edema.  Neurological:     Mental Status: She is alert.     Comments: Gait and balance limitations due to MS         Assessment & Recommendations:   80 y.o. African American female  with hypertension, hyperlipidemia, type 2 diabetes mellitus, CAD s/p PCI, PAF, multiple sclerosis, primary open-angle glaucoma, currently in Afib RVR w/angina  CAD: Ongoing angina symptoms, likely supply demand mismatch in the setting of A. fib with RVR.  Still, need to exclude type I MI.  Recommend ER evaluation, serial troponin LAD and Lcx PCI for NSTEMI (2020) Continue aspirin, atorvastatin, and lisinopril.   PAF: CHA2DS2VASc score 4, annual stroke risk 5% On Eliquis at baseline.  Took a dose at 6 AM this morning.   Currently in RVR 130 bpm.  Blood pressure soft 83/47 bpm.   Recommend ER evaluation, consider IV amiodarone.   Fortunately, she is not symptomatic from her borderline hypotension.   I recommend heparin as per pharmacy consult.  Of note, she took her Eliquis dose at 6 AM this morning.  I will follow the patient, if she does get hospitalized.     Nigel Mormon, MD Pager: [phone] Office: [phone]

## 2021-02-23 NOTE — Progress Notes (Signed)
ANTICOAGULATION CONSULT NOTE - Initial Consult  Pharmacy Consult for heparin Indication: chest pain/ACS  Allergies  Allergen Reactions   Codeine     Other reaction(s): Unknown Other reaction(s): Unknown   Naloxone Other (See Comments)    Other reaction(s): Unknown Other reaction(s): Unknown Other reaction(s): Unknown   Nitrofurantoin     Other reaction(s): Unknown Other reaction(s): Unknown   Sulfa Antibiotics     Other reaction(s): Unknown Other reaction(s): Unknown    Patient Measurements: Height: 5\' 3"  (160 cm) Weight: 45.4 kg (100 lb 1.4 oz) IBW/kg (Calculated) : 52.4 Heparin Dosing Weight: 45.4 kg  Vital Signs: Temp: 98.3 F (36.8 C) (11/21 1201) BP: 175/94 (11/21 1615) Pulse Rate: 68 (11/21 1615)  Labs: Recent Labs    02/23/21 1150 02/23/21 1339  HGB 13.3  --   HCT 42.4  --   PLT 182  --   CREATININE 0.78  --   TROPONINIHS 283* 578*    Estimated Creatinine Clearance: 40.2 mL/min (by C-G formula based on SCr of 0.78 mg/dL).   Medical History: Past Medical History:  Diagnosis Date   CHF (congestive heart failure) (H[facility])    Diabetes mellitus without complication ([facility])    Heart attack ([facility])    Hyperlipidemia    Hypertension    Multiple sclerosis ([facility])     Medications: see MAR  Assessment: 80 yo F with Afib and elevated troponin - on PTA eliquis, last dose unknown. Baseline coag labs pending, CBC ok.   Goal of Therapy:  Heparin level 0.3-0.7 units/ml Heparin level 66-102 units/ml Monitor platelets by anticoagulation protocol: Yes   Plan: Heparin infusion started at 550 units/hr F/u 8h aPTT + HL since on PTA DOAC Monitor daily HL, aPTT, and CBC Resume eliquis when able   96, PharmD, [facility] Emergency Medicine Clinical Pharmacist ED RPh Phone: [phone] Main RX: [phone]

## 2021-02-23 NOTE — ED Notes (Signed)
Report given to receiving RN Steward Drone.

## 2021-02-23 NOTE — ED Notes (Signed)
Called dietary.  They are sending tray.

## 2021-02-23 NOTE — Addendum Note (Signed)
Addended by: Elder Negus on: 02/23/2021 07:00 PM   Modules accepted: Level of Service

## 2021-02-23 NOTE — ED Triage Notes (Signed)
Pt here via EMS from PCP with c/o chest pain that radiates to left shoulder. History of MS. On EMS arrival pt in Afib rate of 130. Pt self converted to NSR rate of 90s

## 2021-02-24 ENCOUNTER — Inpatient Hospital Stay (HOSPITAL_COMMUNITY): Payer: Medicare Other

## 2021-02-24 ENCOUNTER — Encounter (HOSPITAL_COMMUNITY)
Admission: EM | Disposition: A | Discharge status: Home / Self Care | Payer: Self-pay | Source: Ambulatory Visit | Attending: Cardiology

## 2021-02-24 HISTORY — PX: LEFT HEART CATH AND CORONARY ANGIOGRAPHY: CATH118249

## 2021-02-24 LAB — CBC
HCT: 39.7 % (ref 36.0–46.0)
Hemoglobin: 12.7 g/dL (ref 12.0–15.0)
MCH: 29.6 pg (ref 26.0–34.0)
MCHC: 32 g/dL (ref 30.0–36.0)
MCV: 92.5 fL (ref 80.0–100.0)
Platelets: 159 10*3/uL (ref 150–400)
RBC: 4.29 MIL/uL (ref 3.87–5.11)
RDW: 15.1 % (ref 11.5–15.5)
WBC: 5.6 10*3/uL (ref 4.0–10.5)
nRBC: 0 % (ref 0.0–0.2)

## 2021-02-24 LAB — RESP PANEL BY RT-PCR (FLU A&B, COVID) ARPGX2
Influenza A by PCR: NEGATIVE
Influenza B by PCR: NEGATIVE
SARS Coronavirus 2 by RT PCR: NEGATIVE

## 2021-02-24 LAB — ECHOCARDIOGRAM COMPLETE
Area-P 1/2: 2.97 cm2
Height: 63 in
S' Lateral: 0.9 cm
Weight: 1601.42 oz

## 2021-02-24 LAB — BASIC METABOLIC PANEL
Anion gap: 9 (ref 5–15)
BUN: 18 mg/dL (ref 8–23)
CO2: 26 mmol/L (ref 22–32)
Calcium: 9.4 mg/dL (ref 8.9–10.3)
Chloride: 102 mmol/L (ref 98–111)
Creatinine, Ser: 0.72 mg/dL (ref 0.44–1.00)
GFR, Estimated: >60 mL/min (ref >60)
Glucose, Bld: 106 mg/dL — ABNORMAL HIGH (ref 70–99)
Potassium: 4.3 mmol/L (ref 3.5–5.1)
Sodium: 137 mmol/L (ref 135–145)

## 2021-02-24 LAB — APTT
aPTT: 59 seconds — ABNORMAL HIGH (ref 24–36)
aPTT: 85 seconds — ABNORMAL HIGH (ref 24–36)

## 2021-02-24 LAB — HEPARIN LEVEL (UNFRACTIONATED): Heparin Unfractionated: >1.1 IU/mL — ABNORMAL HIGH (ref 0.30–0.70)

## 2021-02-24 SURGERY — LEFT HEART CATH AND CORONARY ANGIOGRAPHY
Anesthesia: LOCAL

## 2021-02-24 MED ORDER — SODIUM CHLORIDE 0.9 % WEIGHT BASED INFUSION: 1.0000 mL/kg/h | INTRAVENOUS | Status: DC

## 2021-02-24 MED ORDER — HEPARIN (PORCINE) IN NACL 1000-0.9 UT/500ML-% IV SOLN
INTRAVENOUS | Status: AC
  Filled 2021-02-24: qty 1000

## 2021-02-24 MED ORDER — SODIUM CHLORIDE 0.9 % WEIGHT BASED INFUSION: 3.0000 mL/kg/h | INTRAVENOUS | Status: DC

## 2021-02-24 MED ORDER — ASPIRIN 81 MG PO CHEW: 81.0000 mg | CHEWABLE_TABLET | ORAL | Status: DC

## 2021-02-24 MED ORDER — HEPARIN SODIUM (PORCINE) 1000 UNIT/ML IJ SOLN
INTRAMUSCULAR | Status: AC
  Filled 2021-02-24: qty 1

## 2021-02-24 MED ORDER — FENTANYL CITRATE (PF) 100 MCG/2ML IJ SOLN
INTRAMUSCULAR | Status: AC
  Filled 2021-02-24: qty 2

## 2021-02-24 MED ORDER — SODIUM CHLORIDE 0.9 % IV SOLN: 250.0000 mL | INTRAVENOUS | Status: DC | PRN

## 2021-02-24 MED ORDER — MIDAZOLAM HCL 2 MG/2ML IJ SOLN
INTRAMUSCULAR | Status: AC
  Filled 2021-02-24: qty 2

## 2021-02-24 MED ORDER — SODIUM CHLORIDE 0.9% FLUSH: 3.0000 mL | INTRAVENOUS | Status: DC | PRN

## 2021-02-24 MED ORDER — HEPARIN SODIUM (PORCINE) 1000 UNIT/ML IJ SOLN
INTRAMUSCULAR | Status: DC | PRN
  Administered 2021-02-24: 2500 [IU] via INTRAVENOUS

## 2021-02-24 MED ORDER — MIDAZOLAM HCL 2 MG/2ML IJ SOLN
INTRAMUSCULAR | Status: DC | PRN
  Administered 2021-02-24: 1 mg via INTRAVENOUS

## 2021-02-24 MED ORDER — HYDRALAZINE HCL 20 MG/ML IJ SOLN: 10.0000 mg | INTRAMUSCULAR | Status: AC | PRN

## 2021-02-24 MED ORDER — HEPARIN (PORCINE) IN NACL 1000-0.9 UT/500ML-% IV SOLN
INTRAVENOUS | Status: DC | PRN
  Administered 2021-02-24 (×2): 500 mL

## 2021-02-24 MED ORDER — ONDANSETRON HCL 4 MG/2ML IJ SOLN: 4.0000 mg | Freq: Four times a day (QID) | INTRAMUSCULAR | Status: DC | PRN

## 2021-02-24 MED ORDER — VERAPAMIL HCL 2.5 MG/ML IV SOLN
INTRAVENOUS | Status: DC | PRN
  Administered 2021-02-24: 10 mL via INTRA_ARTERIAL

## 2021-02-24 MED ORDER — METOPROLOL TARTRATE 25 MG PO TABS
25.0000 mg | ORAL_TABLET | Freq: Two times a day (BID) | ORAL | 2 refills | Status: AC
  Filled 2021-02-24: qty 60, 30d supply, fill #0

## 2021-02-24 MED ORDER — FENTANYL CITRATE (PF) 100 MCG/2ML IJ SOLN
INTRAMUSCULAR | Status: DC | PRN
  Administered 2021-02-24: 50 ug via INTRAVENOUS

## 2021-02-24 MED ORDER — SODIUM CHLORIDE 0.9 % IV SOLN: INTRAVENOUS | Status: AC

## 2021-02-24 MED ORDER — AMLODIPINE BESYLATE 5 MG PO TABS
5.0000 mg | ORAL_TABLET | Freq: Every day | ORAL | 1 refills | Status: DC
Start: 2021-02-25 — End: 2021-02-25
  Filled 2021-02-24: qty 30, 30d supply, fill #0

## 2021-02-24 MED ORDER — ASPIRIN 81 MG PO CHEW
81.0000 mg | CHEWABLE_TABLET | ORAL | Status: AC
  Administered 2021-02-24: 81 mg via ORAL
  Filled 2021-02-24: qty 1

## 2021-02-24 MED ORDER — SODIUM CHLORIDE 0.9 % WEIGHT BASED INFUSION: 3.0000 mL/kg/h | INTRAVENOUS | Status: AC

## 2021-02-24 MED ORDER — SODIUM CHLORIDE 0.9% FLUSH
3.0000 mL | Freq: Two times a day (BID) | INTRAVENOUS | Status: DC
  Administered 2021-02-24 – 2021-02-25 (×2): 3 mL via INTRAVENOUS

## 2021-02-24 MED ORDER — VERAPAMIL HCL 2.5 MG/ML IV SOLN
INTRAVENOUS | Status: AC
  Filled 2021-02-24: qty 2

## 2021-02-24 MED ORDER — LABETALOL HCL 5 MG/ML IV SOLN: 10.0000 mg | INTRAVENOUS | Status: AC | PRN

## 2021-02-24 MED ORDER — IOHEXOL 350 MG/ML SOLN
INTRAVENOUS | Status: DC | PRN
  Administered 2021-02-24: 60 mL

## 2021-02-24 MED ORDER — LIDOCAINE HCL (PF) 1 % IJ SOLN
INTRAMUSCULAR | Status: DC | PRN
  Administered 2021-02-24 (×2): 2 mL

## 2021-02-24 MED ORDER — LIDOCAINE HCL (PF) 1 % IJ SOLN
INTRAMUSCULAR | Status: AC
  Filled 2021-02-24: qty 30

## 2021-02-24 SURGICAL SUPPLY — 12 items
CATH OPTITORQUE TIG 4.0 5F (CATHETERS) ×2 IMPLANT
DEVICE RAD TR BAND REGULAR (VASCULAR PRODUCTS) ×2 IMPLANT
GLIDESHEATH SLEND A-KIT 6F 22G (SHEATH) ×2 IMPLANT
GLIDESHEATH SLEND SS 6F .021 (SHEATH) ×2 IMPLANT
GUIDEWIRE INQWIRE 1.5J.035X260 (WIRE) ×1 IMPLANT
INQWIRE 1.5J .035X260CM (WIRE) ×2
KIT HEART LEFT (KITS) ×2 IMPLANT
PACK CARDIAC CATHETERIZATION (CUSTOM PROCEDURE TRAY) ×2 IMPLANT
SHEATH PROBE COVER 6X72 (BAG) ×2 IMPLANT
TRANSDUCER W/STOPCOCK (MISCELLANEOUS) ×2 IMPLANT
TUBING CIL FLEX 10 FLL-RA (TUBING) ×2 IMPLANT
WIRE HI TORQ VERSACORE-J 145CM (WIRE) ×2 IMPLANT

## 2021-02-24 NOTE — Interval H&P Note (Signed)
History and Physical Interval Note:  02/24/2021 3:49 PM  Joanne Hayden  has presented today for surgery, with the diagnosis of nstemi.  The various methods of treatment have been discussed with the patient and family. After consideration of risks, benefits and other options for treatment, the patient has consented to  Procedure(s): LEFT HEART CATH AND CORONARY ANGIOGRAPHY (N/A) as a surgical intervention.  The patient's history has been reviewed, patient examined, no change in status, stable for surgery.  I have reviewed the patient's chart and labs.  Questions were answered to the patient's satisfaction.    2016 Appropriate Use Criteria for Coronary Revascularization in Patients With Acute Coronary Syndrome NSTEMI/Unstable angina, stabilized patient at high risk Indication:  Revascularization by PCI or CABG of 1 or more arteries in a patient with NSTEMI or unstable angina with Stabilization after presentation High risk for clinical events A (7) Indication: 16; Score 7 329  Joanne Hayden J Joanne Hayden

## 2021-02-24 NOTE — Plan of Care (Signed)
  Problem: Activity: Goal: Risk for activity intolerance will decrease Outcome: Progressing   Problem: Nutrition: Goal: Adequate nutrition will be maintained Outcome: Progressing   Problem: Coping: Goal: Level of anxiety will decrease Outcome: Progressing   

## 2021-02-24 NOTE — Progress Notes (Signed)
Echocardiogram 2D Echocardiogram has been performed.  Warren Lacy Tacori Kvamme RDCS 02/24/2021, 9:50 AM

## 2021-02-24 NOTE — Progress Notes (Signed)
ANTICOAGULATION CONSULT NOTE  Pharmacy Consult for heparin Indication: chest pain/ACS  Allergies  Allergen Reactions   Codeine Nausea Only and Other (See Comments)    Severe nausea   Gabapentin Other (See Comments)    Drowsiness   Naloxone Other (See Comments)    Patient disputes this in 2022   Nitrofurantoin Nausea Only and Other (See Comments)    Severe nausea and made the patient "feel odd"   Norco [Hydrocodone-Acetaminophen] Other (See Comments)    Made the patient "feel odd"   Sulfa Antibiotics Other (See Comments)    Reaction not exactly recalled, but it DID cause an issue    Patient Measurements: Height: 5\' 3"  (160 cm) Weight: 45.4 kg (100 lb 1.4 oz) IBW/kg (Calculated) : 52.4 Heparin Dosing Weight: 45.4 kg  Vital Signs: BP: 113/64 (11/22 0222) Pulse Rate: 83 (11/22 0230)  Labs: Recent Labs    02/23/21 1150 02/23/21 1339 02/24/21 0250  HGB 13.3  --  12.7  HCT 42.4  --  39.7  PLT 182  --  159  APTT  --   --  59*  HEPARINUNFRC  --   --  >1.10*  CREATININE 0.78  --  0.72  TROPONINIHS 283* 578*  --      Estimated Creatinine Clearance: 40.2 mL/min (by C-G formula based on SCr of 0.72 mg/dL).  Assessment:  80 y.o. female with h/o Afib, Eliquis on hold, for heparin  Goal of Therapy:  Heparin level 0.3-0.7 units/ml Heparin level 66-102 units/ml Monitor platelets by anticoagulation protocol: Yes   Plan:  Increase Heparin 650 units/hr aPTT in 8 hours  96, PharmD, BCPS

## 2021-02-25 ENCOUNTER — Encounter (HOSPITAL_COMMUNITY): Payer: Self-pay | Admitting: Cardiology

## 2021-02-25 ENCOUNTER — Other Ambulatory Visit (HOSPITAL_COMMUNITY): Payer: Self-pay

## 2021-02-25 ENCOUNTER — Telehealth: Payer: Self-pay

## 2021-02-25 LAB — BASIC METABOLIC PANEL
Anion gap: 5 (ref 5–15)
BUN: 17 mg/dL (ref 8–23)
CO2: 28 mmol/L (ref 22–32)
Calcium: 8.8 mg/dL — ABNORMAL LOW (ref 8.9–10.3)
Chloride: 104 mmol/L (ref 98–111)
Creatinine, Ser: 0.77 mg/dL (ref 0.44–1.00)
GFR, Estimated: >60 mL/min (ref >60)
Glucose, Bld: 95 mg/dL (ref 70–99)
Potassium: 4.2 mmol/L (ref 3.5–5.1)
Sodium: 137 mmol/L (ref 135–145)

## 2021-02-25 LAB — CBC
HCT: 38.4 % (ref 36.0–46.0)
Hemoglobin: 11.8 g/dL — ABNORMAL LOW (ref 12.0–15.0)
MCH: 29.1 pg (ref 26.0–34.0)
MCHC: 30.7 g/dL (ref 30.0–36.0)
MCV: 94.8 fL (ref 80.0–100.0)
Platelets: 166 10*3/uL (ref 150–400)
RBC: 4.05 MIL/uL (ref 3.87–5.11)
RDW: 15.2 % (ref 11.5–15.5)
WBC: 5 10*3/uL (ref 4.0–10.5)
nRBC: 0 % (ref 0.0–0.2)

## 2021-02-25 LAB — APTT: aPTT: 31 seconds (ref 24–36)

## 2021-02-25 LAB — HEPARIN LEVEL (UNFRACTIONATED): Heparin Unfractionated: 0.33 IU/mL (ref 0.30–0.70)

## 2021-02-25 MED ORDER — APIXABAN 2.5 MG PO TABS
2.5000 mg | ORAL_TABLET | Freq: Two times a day (BID) | ORAL | Status: DC
  Administered 2021-02-25: 2.5 mg via ORAL
  Filled 2021-02-25: qty 1

## 2021-02-25 NOTE — Progress Notes (Signed)
TR BAND REMOVAL  LOCATION:    right radial  DEFLATED PER PROTOCOL:    Yes.    TIME BAND OFF / DRESSING APPLIED:    2000   SITE UPON ARRIVAL:    Level 1  SITE AFTER BAND REMOVAL:    Level 1  CIRCULATION SENSATION AND MOVEMENT:    Within Normal Limits   Yes.    COMMENTS:   Pt.tolerated procedure well

## 2021-02-25 NOTE — Telephone Encounter (Signed)
Patient is still currently in the hospital.

## 2021-02-25 NOTE — Progress Notes (Signed)
Discharge instructions (including medications) discussed with and copy provided to patient/caregiver 

## 2021-02-25 NOTE — Plan of Care (Signed)
  Problem: Education: Goal: Knowledge of General Education information will improve Description: Including pain rating scale, medication(s)/side effects and non-pharmacologic comfort measures Outcome: Adequate for Discharge   

## 2021-02-25 NOTE — Telephone Encounter (Signed)
-----   Message from Elder Negus, MD sent at 02/25/2021  8:11 AM EST ----- Regarding: TOC & appt Discharge follow up: TOC: Needed Follow up appt: 1-2 weeks w/CC or me Discharge diagnosis: PAF Discharge date: 02/25/2021  Thanks MJP

## 2021-02-25 NOTE — Discharge Summary (Addendum)
Physician Discharge Summary  Patient ID: Joanne Hayden MRN: [id number] DOB/AGE: 1940-09-15 80 y.o.  Admit date: 02/23/2021 Discharge date: 02/25/2021  Primary Discharge Diagnosis: Type II myocardial infarction Paroxysmal atrial fibrillation Coronary artery disease  Secondary Discharge Diagnosis: Tricuspid regurgitation Multiple sclerosis   Hospital Course:   Joanne Hayden is 80 y.o. AA female with hypertension, hyperlipidemia, type 2 diabetes mellitus, CAD s/p PCI to LAD and Lcx (2020), PAF, multiple sclerosis, primary open-angle glaucoma.  Patient was admitted from our office on 02/23/2041 after she was found to be in A. fib RVR and had complaints of chest pain and arm tingling.  Patient had troponin elevation up to 500.  Her atrial fibrillation resolved on its own by the time she arrived to the hospital.  Coronary angiogram showed patent stents, diffuse disease in small diagonal, otherwise no acute findings to explain type I MI.  Patient chest pain resolved.  She was started on metoprolol 25 mg twice daily.  Eliquis was resumed.  Given that this was not type I MI, aspirin was stopped to reduce bleeding risk.   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  She is completely asymptomatic from this.  Do not think she will need any intervention at this time.  We will follow-up with outpatient serial echocardiogram.   Discharge Exam: Blood pressure (!) 126/49, pulse 64, temperature 98.2 F (36.8 C), temperature source Oral, resp. rate 18, height 5\' 3"  (1.6 m), weight 45.4 kg, SpO2 100 %.   Vitals and nursing note reviewed.  Constitutional:      General: She is not in acute distress.    Appearance: She is well-developed.  HENT:     Head: Normocephalic and atraumatic.  Eyes:     Conjunctiva/sclera: Conjunctivae normal.     Pupils: Pupils are equal, round, and reactive to light.  Neck:     Vascular: No JVD.  Cardiovascular:     Rate and Rhythm:  Normal rate and regular rhythm.     Pulses: Normal pulses and intact distal pulses.     Heart sounds: No murmur heard. Pulmonary:     Effort: Pulmonary effort is normal.     Breath sounds: Normal breath sounds. No wheezing or rales.  Abdominal:     General: Bowel sounds are normal.     Palpations: Abdomen is soft.     Tenderness: There is no rebound.  Musculoskeletal:        General: No tenderness. Normal range of motion.     Right lower leg: No edema.  Lymphadenopathy:     Cervical: No cervical adenopathy.  Skin:    General: Skin is warm and dry.  Neurological:     Mental Status: She is alert and oriented to person, place, and time.     Cranial Nerves: No cranial nerve deficit.     Comments: LE weakness, due to MS. Baseline      Significant Diagnostic Studies:  EKG 02/23/2021: Sinus rhythm Nonspecific ST-T changes   EKG 02/23/2021: Atrial fibrillation 128 bpm Low voltage in precordial leads Anterior infarct -age undetermined Negative precordial T-waves  -May be normal -consider anteroseptal ischemia   Echocardiogram 02/24/2021:  1. Left ventricular ejection fraction, by estimation, is >75%. The left  ventricle has hyperdynamic function. The left ventricle has no regional  wall motion abnormalities. Left ventricular diastolic parameters were  normal.   2. Right ventricular systolic function is low normal. The right  ventricular size is not well visualized. There is normal pulmonary  artery  systolic pressure.   3. Right atrial size was moderately dilated.   4. The mitral valve is grossly normal. No evidence of mitral valve  regurgitation.   5. Tricuspid valve regurgitation is moderate to severe. Estimated PASP 27  mmHg.   6. The aortic valve is tricuspid. Aortic valve regurgitation is trivial.   7. The inferior vena cava is normal in size with <50% respiratory  variability, suggesting right atrial pressure of 8 mmHg.   Coronary angiography 02/24/2021: LM:  Normal LAD: Patent prox-mid LAD stents. No significant stenosis          Diag 2 (small to medium caliber) with prox diffuse 80% disease, likely chronic LCX: Prox 20% disease         Prox-mid stents. No significant stenosis         Distal focal 60% stenosis in tortuous part of the vessel RCA: Large vessel. No significant disease          Slow flow in RPLA, likely due to possible RV dilatation   Normal LVEDP   This episode was most likely type 2 MI in the setting of Afib w/RVR. Continue eliquis for PAF, resume on 11/23 AM. Will omit Aspirin to reduce bleeding risk    Labs:   Lab Results  Component Value Date   WBC 5.0 02/25/2021   HGB 11.8 (L) 02/25/2021   HCT 38.4 02/25/2021   MCV 94.8 02/25/2021   PLT 166 02/25/2021    Recent Labs  Lab 02/25/21 0213  NA 137  K 4.2  CL 104  CO2 28  BUN 17  CREATININE 0.77  CALCIUM 8.8*  GLUCOSE 95     Latest Reference Range & Units 02/23/21 11:50 02/23/21 13:39  Troponin I (High Sensitivity) <18 ng/L 283 (HH) 578 (HH)  ([place]): Data is critically high  Radiology: DG Chest 2 View  Result Date: 02/23/2021 CLINICAL DATA:  Chest pain EXAM: CHEST - 2 VIEW COMPARISON:  02/12/2020 FINDINGS: Mild cardiomegaly. Lungs clear. No effusions or edema. No acute bony abnormality. IMPRESSION: Cardiomegaly.  No active disease. Electronically Signed   By: Rolm Baptise M.D.   On: 02/23/2021 12:23   CARDIAC CATHETERIZATION  Addendum Date: 02/24/2021   LM: Normal LAD: Patent prox-mid LAD stents. No significant stenosis          Diag 2 (small to medium caliber) with prox diffuse 80% disease, likely chronic LCX: Prox 20% disease         Prox-mid stents. No significant stenosis         Distal focal 60% stenosis in tortuous part of the vessel RCA: Large vessel. No significant disease          Slow flow in RPLA, likely due to possible RV dilatation Normal LVEDP This episode was most likely type 2 MI in the setting of Afib w/RVR. Continue eliquis for PAF, resume  on 11/23 AM. Will omit Aspirin to reduce bleeding risk Nigel Mormon, MD Pager: [phone] Office: [phone]   Result Date: 02/24/2021 Images from the original result were not included. LM: Normal LAD: Patent prox-mid LAD stents. No significant stenosis          Diag 2 (small to medium caliber) with prox diffuse 80% disease, likely chronic LCX: Prox 20% disease         Prox-mid stents. No significant stenosis         Distal focal 60% stenosis in tortuous part of the vessel RCA: Large vessel. No significant disease  Slow flow in RPLA, likely due to possible RV dilatation Normal LVEDP This episode was most likely type 2 MI in the setting of Afib w/RVR. Continue eliquis for PAF Will omit Aspirin to reduce bleeding risk Nigel Mormon, MD Pager: [phone] Office: [phone]   ECHOCARDIOGRAM COMPLETE  Result Date: 02/24/2021    ECHOCARDIOGRAM REPORT   Patient Name:   Joanne Hayden Date of Exam: 02/24/2021 Medical Rec #:  [id number]       Height:       63.0 in Accession #:    [id number]      Weight:       100.1 lb Date of Birth:  December 12, 1940        BSA:          1.441 m Patient Age:    89 years        BP:           108/71 mmHg Patient Gender: F               HR:           64 bpm. Exam Location:  Inpatient Procedure: 2D Echo, Color Doppler and Cardiac Doppler Indications:    R07.9* Chest pain, unspecified  History:        Patient has prior history of Echocardiogram examinations, most                 recent 12/22/2019. CAD, Arrythmias:Atrial Fibrillation; Risk                 Factors:Hypertension, Diabetes and Dyslipidemia.  Sonographer:    Raquel Sarna Senior RDCS Referring Phys: [id number] [place]  1. Left ventricular ejection fraction, by estimation, is >75%. The left ventricle has hyperdynamic function. The left ventricle has no regional wall motion abnormalities. Left ventricular diastolic parameters were normal.  2. Right ventricular systolic function is low normal. The  right ventricular size is not well visualized. There is normal pulmonary artery systolic pressure.  3. Right atrial size was moderately dilated.  4. The mitral valve is grossly normal. No evidence of mitral valve regurgitation.  5. Tricuspid valve regurgitation is moderate to severe. Estimated PASP 27 mmHg.  6. The aortic valve is tricuspid. Aortic valve regurgitation is trivial.  7. The inferior vena cava is normal in size with <50% respiratory variability, suggesting right atrial pressure of 8 mmHg. FINDINGS  Left Ventricle: Left ventricular ejection fraction, by estimation, is >75%. The left ventricle has hyperdynamic function. The left ventricle has no regional wall motion abnormalities. The left ventricular internal cavity size was small. There is no left  ventricular hypertrophy. Left ventricular diastolic parameters were normal. Right Ventricle: The right ventricular size is not well visualized. Right vetricular wall thickness was not well visualized. Right ventricular systolic function is low normal. There is normal pulmonary artery systolic pressure. The tricuspid regurgitant velocity is 2.19 m/s, and with an assumed right atrial pressure of 8 mmHg, the estimated right ventricular systolic pressure is [id number] mmHg. Left Atrium: Left atrial size was normal in size. Right Atrium: Right atrial size was moderately dilated. Pericardium: There is no evidence of pericardial effusion. Mitral Valve: The mitral valve is grossly normal. No evidence of mitral valve regurgitation. Tricuspid Valve: The tricuspid valve is grossly normal. Tricuspid valve regurgitation is moderate to severe. Aortic Valve: The aortic valve is tricuspid. Aortic valve regurgitation is trivial. Pulmonic Valve: The pulmonic valve was grossly normal. Pulmonic valve regurgitation is mild. Aorta: The aortic root and  ascending aorta are structurally normal, with no evidence of dilitation. Venous: The inferior vena cava is normal in size with less than  50% respiratory variability, suggesting right atrial pressure of 8 mmHg. IAS/Shunts: The interatrial septum was not assessed.  LEFT VENTRICLE PLAX 2D LVIDd:         2.50 cm   Diastology LVIDs:         0.90 cm   LV e' medial:    6.53 cm/s LV PW:         0.90 cm   LV E/e' medial:  10.1 LV IVS:        0.90 cm   LV e' lateral:   9.25 cm/s LVOT diam:     1.70 cm   LV E/e' lateral: 7.1 LV SV:         50 LV SV Index:   35 LVOT Area:     2.27 cm  RIGHT VENTRICLE RV S prime:     9.25 cm/s LEFT ATRIUM             Index        RIGHT ATRIUM           Index LA diam:        2.20 cm 1.53 cm/m   RA Area:     19.30 cm LA Vol (A2C):   28.2 ml 19.57 ml/m  RA Volume:   56.20 ml  39.01 ml/m LA Vol (A4C):   24.4 ml 16.94 ml/m LA Biplane Vol: 28.1 ml 19.50 ml/m  AORTIC VALVE LVOT Vmax:   91.50 cm/s LVOT Vmean:  68.500 cm/s LVOT VTI:    0.219 m  AORTA Ao Root diam: 2.50 cm MITRAL VALVE               TRICUSPID VALVE MV Area (PHT): 2.97 cm    TR Peak grad:   19.2 mmHg MV Decel Time: 255 msec    TR Vmax:        219.00 cm/s MV E velocity: 65.70 cm/s MV A velocity: 69.80 cm/s  SHUNTS MV E/A ratio:  0.94        Systemic VTI:  0.22 m                            Systemic Diam: 1.70 cm Emanuelle Bastos MD Electronically signed by Truett Mainland MD Signature Date/Time: 02/24/2021/11:01:41 AM    Final       FOLLOW UP PLANS AND APPOINTMENTS Discharge Instructions     Diet - low sodium heart healthy   Complete by: As directed    Increase activity slowly   Complete by: As directed       Allergies as of 02/25/2021       Reactions   Codeine Nausea Only, Other (See Comments)   Severe nausea   Gabapentin Other (See Comments)   Drowsiness   Naloxone Other (See Comments)   Patient disputes this in 2022   Nitrofurantoin Nausea Only, Other (See Comments)   Severe nausea and made the patient "feel odd"   Norco [hydrocodone-acetaminophen] Other (See Comments)   Made the patient "feel odd"   Sulfa Antibiotics Other (See  Comments)   Reaction not exactly recalled, but it DID cause an issue        Medication List     STOP taking these medications    aspirin EC 81 MG tablet       TAKE these medications  acetaminophen 325 MG tablet Commonly known as: TYLENOL Take 325 mg by mouth every 6 (six) hours as needed for mild pain or headache.   atorvastatin 40 MG tablet Commonly known as: LIPITOR Take 40 mg by mouth at bedtime.   dorzolamide-timolol 22.3-6.8 MG/ML ophthalmic solution Commonly known as: COSOPT Place 1 drop into both eyes in the morning and at bedtime.   Eliquis 2.5 MG Tabs tablet Generic drug: apixaban Take 2.5 mg by mouth 2 (two) times daily.   FreeStyle Libre 14 Day Reader Hardie Pulley See admin instructions.   FreeStyle Libre 2 Sensor Misc Inject 1 Device into the skin every 14 (fourteen) days.   latanoprost 0.005 % ophthalmic solution Commonly known as: XALATAN Place 1 drop into both eyes at bedtime.   lisinopril 10 MG tablet Commonly known as: ZESTRIL Take 1 tablet (10 mg total) by mouth daily. What changed: when to take this   metoprolol tartrate 25 MG tablet Commonly known as: LOPRESSOR Take 1 tablet (25 mg total) by mouth 2 (two) times daily.   Rhopressa 0.02 % Soln Generic drug: Netarsudil Dimesylate 1 drop into affected eye in the evening           Elder Negus, MD Pager: [phone] Office: [phone]

## 2021-02-25 NOTE — Care Management (Addendum)
1236 02-25-21 Patient is calling to schedule her I-ride for transportation home. Patient now has her motorized wheelchair. Staff RN is aware that the patient is scheduling transportation.

## 2021-03-05 DIAGNOSIS — I2510 Atherosclerotic heart disease of native coronary artery without angina pectoris: Secondary | ICD-10-CM | POA: Diagnosis not present

## 2021-03-05 DIAGNOSIS — E1169 Type 2 diabetes mellitus with other specified complication: Secondary | ICD-10-CM | POA: Diagnosis not present

## 2021-03-05 DIAGNOSIS — R2681 Unsteadiness on feet: Secondary | ICD-10-CM | POA: Diagnosis not present

## 2021-03-05 DIAGNOSIS — R233 Spontaneous ecchymoses: Secondary | ICD-10-CM | POA: Diagnosis not present

## 2021-03-05 DIAGNOSIS — M79601 Pain in right arm: Secondary | ICD-10-CM | POA: Diagnosis not present

## 2021-03-05 DIAGNOSIS — I10 Essential (primary) hypertension: Secondary | ICD-10-CM | POA: Diagnosis not present

## 2021-03-05 DIAGNOSIS — I252 Old myocardial infarction: Secondary | ICD-10-CM | POA: Diagnosis not present

## 2021-03-05 DIAGNOSIS — E440 Moderate protein-calorie malnutrition: Secondary | ICD-10-CM | POA: Diagnosis not present

## 2021-03-13 ENCOUNTER — Ambulatory Visit: Payer: Medicare Other | Admitting: Podiatry

## 2021-03-23 IMAGING — CR DG CHEST 2V
2 series · 2 of 2 positions shown · non-contrast
Comparison: None.

CLINICAL DATA: 79-year-old female with tricuspid regurgitation.
Coronary artery disease. Angina. Former smoker.

EXAM:
CHEST - 2 VIEW

[chest pa]
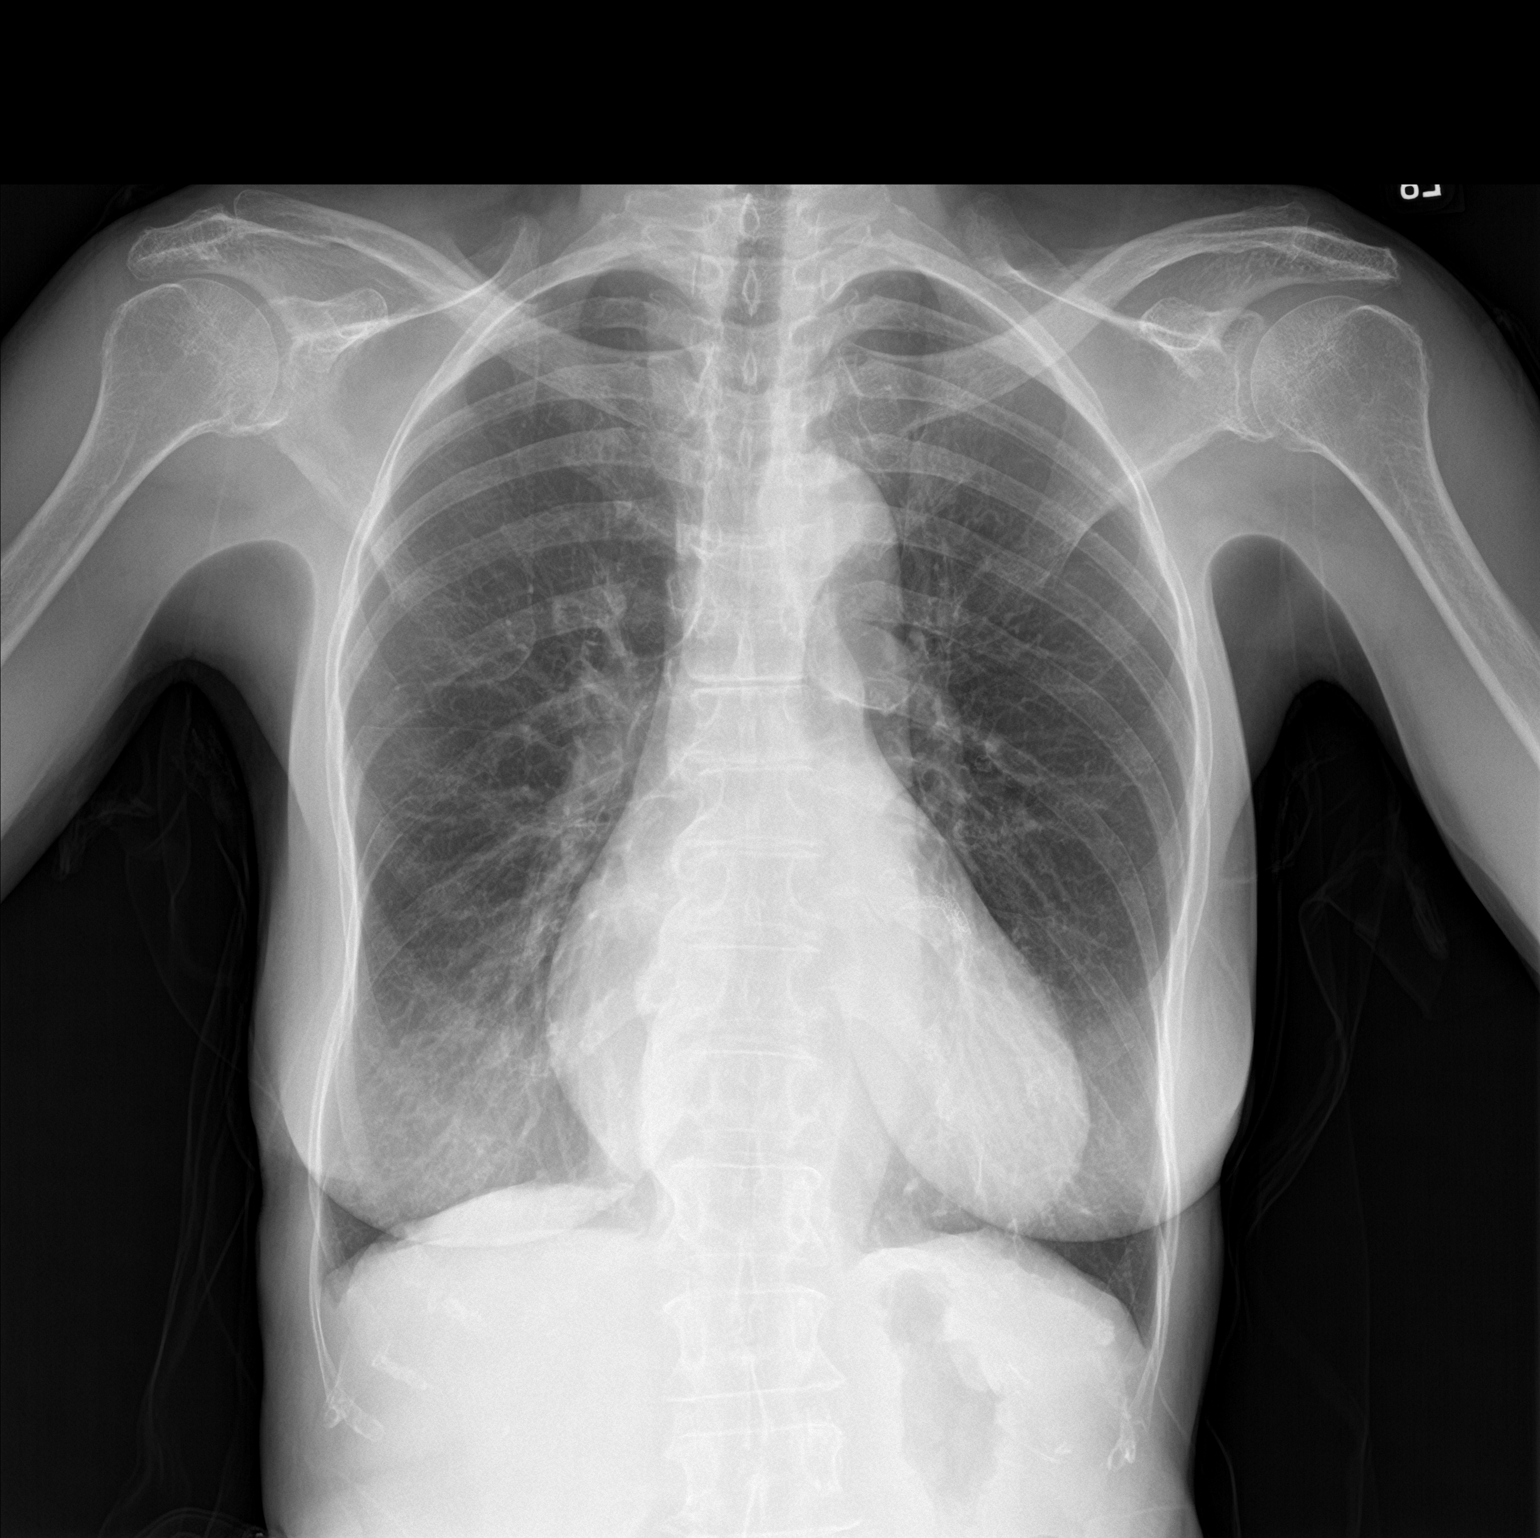

[chest lat]
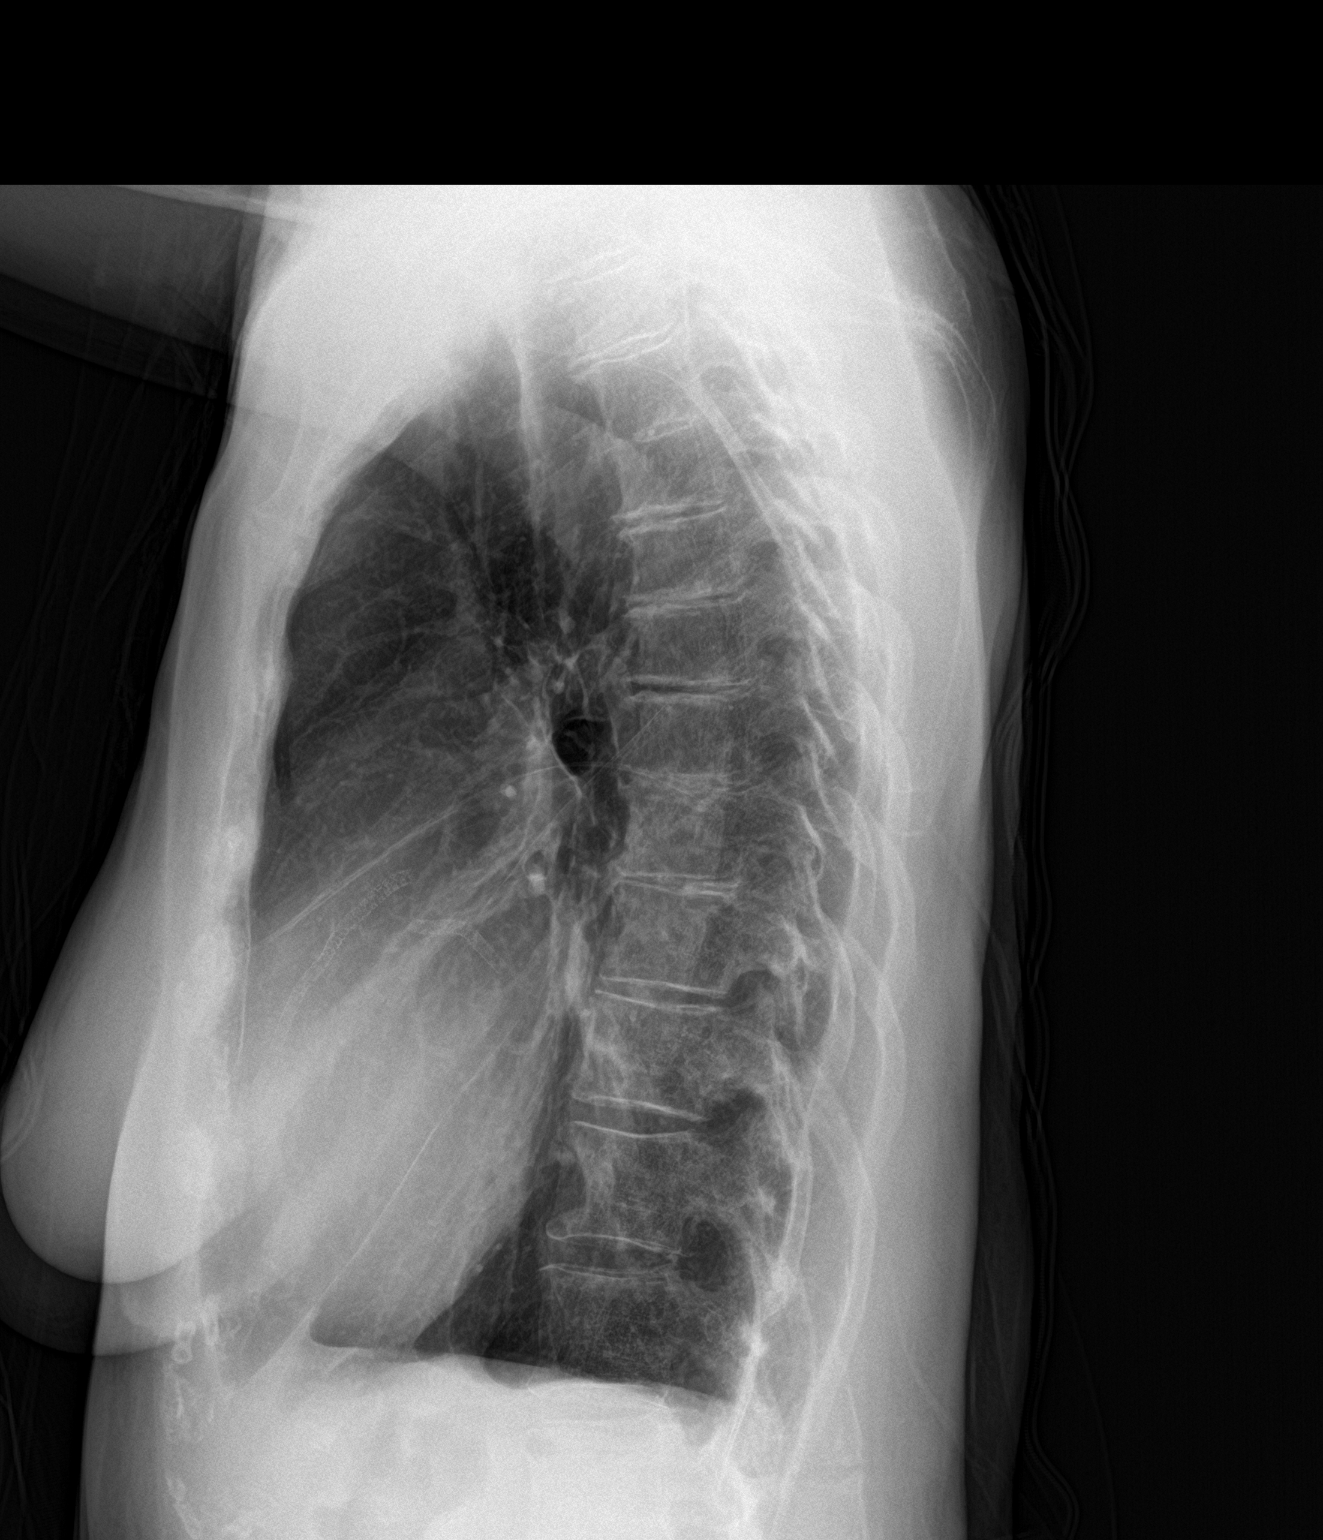

[2 of 2 positions shown; findings below may reference images not displayed]

FINDINGS: Mild-to-moderate cardiomegaly. Other mediastinal contours are within
normal limits. Visualized tracheal air column is within normal
limits. Large lung volumes with flattened hemidiaphragms. Mild
bilateral increased pulmonary interstitial markings. No
pneumothorax, pulmonary edema, pleural effusion or confluent
pulmonary opacity.

No acute osseous abnormality identified. Negative visible bowel as
pattern.
IMPRESSION: 1. Cardiomegaly.  Pulmonary hyperinflation.
2.  No acute cardiopulmonary abnormality identified.

## 2021-04-23 DIAGNOSIS — I480 Paroxysmal atrial fibrillation: Secondary | ICD-10-CM | POA: Diagnosis not present

## 2021-04-23 DIAGNOSIS — I10 Essential (primary) hypertension: Secondary | ICD-10-CM | POA: Diagnosis not present

## 2021-04-23 DIAGNOSIS — E1169 Type 2 diabetes mellitus with other specified complication: Secondary | ICD-10-CM | POA: Diagnosis not present

## 2021-04-23 DIAGNOSIS — E785 Hyperlipidemia, unspecified: Secondary | ICD-10-CM | POA: Diagnosis not present

## 2021-04-23 DIAGNOSIS — I2510 Atherosclerotic heart disease of native coronary artery without angina pectoris: Secondary | ICD-10-CM | POA: Diagnosis not present

## 2021-04-23 DIAGNOSIS — G35 Multiple sclerosis: Secondary | ICD-10-CM | POA: Diagnosis not present

## 2021-04-23 DIAGNOSIS — M810 Age-related osteoporosis without current pathological fracture: Secondary | ICD-10-CM | POA: Diagnosis not present

## 2021-04-27 ENCOUNTER — Other Ambulatory Visit (HOSPITAL_COMMUNITY): Payer: Self-pay

## 2021-05-27 ENCOUNTER — Other Ambulatory Visit: Payer: Self-pay

## 2021-05-27 ENCOUNTER — Ambulatory Visit: Payer: Medicare Other | Admitting: Podiatry

## 2021-05-27 DIAGNOSIS — E1142 Type 2 diabetes mellitus with diabetic polyneuropathy: Secondary | ICD-10-CM | POA: Diagnosis not present

## 2021-05-27 DIAGNOSIS — M79674 Pain in right toe(s): Secondary | ICD-10-CM

## 2021-05-27 DIAGNOSIS — M2042 Other hammer toe(s) (acquired), left foot: Secondary | ICD-10-CM

## 2021-05-27 DIAGNOSIS — B351 Tinea unguium: Secondary | ICD-10-CM

## 2021-05-27 DIAGNOSIS — G35 Multiple sclerosis: Secondary | ICD-10-CM

## 2021-05-27 DIAGNOSIS — L84 Corns and callosities: Secondary | ICD-10-CM

## 2021-05-27 DIAGNOSIS — Q828 Other specified congenital malformations of skin: Secondary | ICD-10-CM | POA: Diagnosis not present

## 2021-05-27 DIAGNOSIS — M79675 Pain in left toe(s): Secondary | ICD-10-CM | POA: Diagnosis not present

## 2021-05-27 DIAGNOSIS — M2041 Other hammer toe(s) (acquired), right foot: Secondary | ICD-10-CM

## 2021-06-02 NOTE — Progress Notes (Signed)
°  Subjective:  Patient ID: Joanne Hayden, female    DOB: 1941/02/24,  MRN: [id number]  Joanne Hayden presents to clinic today for at risk foot care with history of diabetic neuropathy and callus(es) left hallux, porokeratosis right foot and painful thick toenails that are difficult to trim. Painful toenails interfere with ambulation. Aggravating factors include wearing enclosed shoe gear. Pain is relieved with periodic professional debridement. Painful calluses are aggravated when weightbearing with and without shoegear. Pain is relieved with periodic professional debridement.  Patient did not check blood glucose today.  New problem(s): None.   PCP is Lorenda Ishihara, MD , and last visit was November 21, 2020.  Allergies  Allergen Reactions   Codeine Nausea Only and Other (See Comments)    Severe nausea   Gabapentin Other (See Comments)    Drowsiness   Naloxone Other (See Comments)    Patient disputes this in 2022   Nitrofurantoin Nausea Only and Other (See Comments)    Severe nausea and made the patient "feel odd"   Norco [Hydrocodone-Acetaminophen] Other (See Comments)    Made the patient "feel odd"   Sulfa Antibiotics Other (See Comments)    Reaction not exactly recalled, but it DID cause an issue    Review of Systems: Negative except as noted in the HPI. Objective:   Constitutional LAKYRA TIPPINS is a pleasant 81 y.o. African American female, in NAD. AAO x 3.   Vascular CFT <3 seconds b/l LE. Faintly palpable DP pulses b/l LE. Faintly palpable PT pulse(s) b/l LE. Pedal hair absent. No pain with calf compression b/l. Lower extremity skin temperature gradient within normal limits. No edema noted b/l LE. No cyanosis or clubbing noted b/l LE.  Neurologic Normal speech. Oriented to person, place, and time. Protective sensation decreased with 10 gram monofilament b/l.  Dermatologic Pedal skin is warm and supple b/l LE. No open wounds b/l LE. No interdigital macerations noted  b/l LE. Toenails 1-5 bilaterally elongated, discolored, dystrophic, thickened, and crumbly with subungual debris and tenderness to dorsal palpation. Hyperkeratotic lesion(s) L hallux.  No erythema, no edema, no drainage, no fluctuance. Porokeratotic lesion(s) submet head 3 right foot. No erythema, no edema, no drainage, no fluctuance. Pressure spot noted distal lateral aspect of left 4th digit. No edema, no drainage, no tissue loss.  Orthopedic: Hammertoe deformity noted 2-5 b/l. Utilizes motorized chair for mobility assistance.   Radiographs: None  Last A1c: No flowsheet data found.   Assessment:   1. Pain due to onychomycosis of toenails of both feet   2. Callus   3. Porokeratosis   4. Multiple sclerosis (HCC)   5. Acquired hammertoes of both feet   6. Diabetic peripheral neuropathy associated with type 2 diabetes mellitus (HCC)    Plan:  Patient was evaluated and treated and all questions answered. Consent given for treatment as described below: -Patient to continue soft, supportive shoe gear daily. Start procedure for diabetic shoes. Patient qualifies based on diagnoses. -Patient to schedule appointment with Pedorthist for diabetic shoe measurements. -Toenails 1-5 b/l were debrided in length and girth with sterile nail nippers and dremel without iatrogenic bleeding.  -Callus(es) L hallux pared utilizing sterile scalpel blade without complication or incident. Total number debrided =1. -Painful porokeratotic lesion(s) submet head 3 right foot pared and enucleated with sterile scalpel blade without incident. Total number of lesions debrided=1. -Patient/POA to call should there be question/concern in the interim.  Return in about 3 months (around 08/24/2021).  Freddie Breech, DPM

## 2021-06-17 ENCOUNTER — Other Ambulatory Visit: Payer: Self-pay

## 2021-06-17 ENCOUNTER — Ambulatory Visit: Payer: Medicare Other

## 2021-06-17 DIAGNOSIS — E1169 Type 2 diabetes mellitus with other specified complication: Secondary | ICD-10-CM

## 2021-06-17 DIAGNOSIS — M2041 Other hammer toe(s) (acquired), right foot: Secondary | ICD-10-CM

## 2021-06-17 DIAGNOSIS — L84 Corns and callosities: Secondary | ICD-10-CM

## 2021-06-17 NOTE — Progress Notes (Signed)
SITUATION ?Reason for Consult: Evaluation for Prefabricated Diabetic Shoes and Custom Diabetic Inserts. ?Patient / Caregiver Report: Patient would like well fitting shoes ? ?OBJECTIVE DATA: ?Patient History / Diagnosis:  ?  ICD-10-CM   ?1. Type 2 diabetes mellitus with other specified complication, without long-term current use of insulin (HCC)  E11.69   ?  ?2. Acquired hammertoes of both feet  M20.41   ? M20.42   ?  ?3. Corns and callosities  L84   ?  ? ? ?Current or Previous Devices:   None and no history ? ?In-Person Foot Examination: ?Ulcers & Callousing:   Historical ? ?Deformities:   ?- Hammertoes ?  ?Shoe Size: 7.31M ? ?ORTHOTIC RECOMMENDATION ?Recommended Devices: ?- 1x pair prefabricated PDAC approved diabetic shoes; Patient Selected - Apex [id number] Size 7.31M ?- 3x pair custom-to-patient PDAC approved vacuum formed diabetic insoles. ? ?GOALS OF SHOES AND INSOLES ?- Reduce shear and pressure ?- Reduce / Prevent callus formation ?- Reduce / Prevent ulceration ?- Protect the fragile healing compromised diabetic foot. ? ?Patient would benefit from diabetic shoes and inserts as patient has diabetes mellitus and the patient has one or more of the following conditions: ?- History of pre-ulcerative callus ?- Peripheral neuropathy with evidence of callus formation ?- Foot deformity ?- Poor circulation ? ?ACTIONS PERFORMED ?Patient was casted for insoles via crush box and measured for shoes via brannock device. Procedure was explained and patient tolerated procedure well. All questions were answered and concerns addressed. ? ?PLAN ?Patient is to be contacted and scheduled for fitting once CMN is obtained from treaing physician and shoes and insoles have been fabricated and received. ? ?

## 2021-07-30 DIAGNOSIS — I10 Essential (primary) hypertension: Secondary | ICD-10-CM | POA: Diagnosis not present

## 2021-07-30 DIAGNOSIS — I2510 Atherosclerotic heart disease of native coronary artery without angina pectoris: Secondary | ICD-10-CM | POA: Diagnosis not present

## 2021-07-30 DIAGNOSIS — E1169 Type 2 diabetes mellitus with other specified complication: Secondary | ICD-10-CM | POA: Diagnosis not present

## 2021-07-30 DIAGNOSIS — M810 Age-related osteoporosis without current pathological fracture: Secondary | ICD-10-CM | POA: Diagnosis not present

## 2021-07-30 DIAGNOSIS — E785 Hyperlipidemia, unspecified: Secondary | ICD-10-CM | POA: Diagnosis not present

## 2021-07-30 DIAGNOSIS — I480 Paroxysmal atrial fibrillation: Secondary | ICD-10-CM | POA: Diagnosis not present

## 2021-08-04 DIAGNOSIS — H401134 Primary open-angle glaucoma, bilateral, indeterminate stage: Secondary | ICD-10-CM | POA: Diagnosis not present

## 2021-08-04 DIAGNOSIS — I480 Paroxysmal atrial fibrillation: Secondary | ICD-10-CM | POA: Diagnosis not present

## 2021-08-04 DIAGNOSIS — I10 Essential (primary) hypertension: Secondary | ICD-10-CM | POA: Diagnosis not present

## 2021-08-04 DIAGNOSIS — E440 Moderate protein-calorie malnutrition: Secondary | ICD-10-CM | POA: Diagnosis not present

## 2021-08-04 DIAGNOSIS — E1169 Type 2 diabetes mellitus with other specified complication: Secondary | ICD-10-CM | POA: Diagnosis not present

## 2021-08-04 DIAGNOSIS — R54 Age-related physical debility: Secondary | ICD-10-CM | POA: Diagnosis not present

## 2021-08-04 DIAGNOSIS — G35 Multiple sclerosis: Secondary | ICD-10-CM | POA: Diagnosis not present

## 2021-08-04 DIAGNOSIS — L84 Corns and callosities: Secondary | ICD-10-CM | POA: Diagnosis not present

## 2021-08-04 DIAGNOSIS — M810 Age-related osteoporosis without current pathological fracture: Secondary | ICD-10-CM | POA: Diagnosis not present

## 2021-09-01 ENCOUNTER — Encounter: Payer: Self-pay | Admitting: Podiatry

## 2021-09-01 ENCOUNTER — Ambulatory Visit (INDEPENDENT_AMBULATORY_CARE_PROVIDER_SITE_OTHER): Payer: Medicare Other | Admitting: Podiatry

## 2021-09-01 DIAGNOSIS — L84 Corns and callosities: Secondary | ICD-10-CM | POA: Diagnosis not present

## 2021-09-01 DIAGNOSIS — G35 Multiple sclerosis: Secondary | ICD-10-CM

## 2021-09-01 DIAGNOSIS — B351 Tinea unguium: Secondary | ICD-10-CM

## 2021-09-01 DIAGNOSIS — Q828 Other specified congenital malformations of skin: Secondary | ICD-10-CM

## 2021-09-01 DIAGNOSIS — E119 Type 2 diabetes mellitus without complications: Secondary | ICD-10-CM | POA: Diagnosis not present

## 2021-09-01 DIAGNOSIS — E1142 Type 2 diabetes mellitus with diabetic polyneuropathy: Secondary | ICD-10-CM

## 2021-09-03 NOTE — Progress Notes (Signed)
ANNUAL DIABETIC FOOT EXAM  Subjective: Joanne Hayden presents today for annual diabetic foot examination.  Patient relates >20 year h/o diabetes.  Patient denies any h/o foot wounds.  Patient has been diagnosed with multiple sclerosis.  Last known  HgA1c was unknown. Patient did not check blood glucose this morning.  Risk factors: diabetes, h/o MI, HTN, CAD, CHF, dyslipidemia, multiple sclerosis .  Lorenda Ishihara, MD is patient's PCP. Last visit was Aug 04, 2021.  Patient would like to know status of her diabetic shoes.  Past Medical History:  Diagnosis Date   CHF (congestive heart failure) (HCC)    Diabetes mellitus without complication (HCC)    Heart attack (HCC)    Hyperlipidemia    Hypertension    Multiple sclerosis (HCC)    Patient Active Problem List   Diagnosis Date Noted   PAF (paroxysmal atrial fibrillation) (HCC) 02/23/2021   Atrial fibrillation with rapid ventricular response (HCC) 02/23/2021   NSTEMI (non-ST elevated myocardial infarction) (HCC) 02/23/2021   Decreased estrogen level 12/03/2020   Abnormal gait 06/03/2020   Dyslipidemia 06/03/2020   Malnutrition of moderate degree ([place]: 60% to less than 75% of standard weight) (HCC) 06/03/2020   Multiple sclerosis (HCC) 06/03/2020   Primary open angle glaucoma (POAG) of both eyes, indeterminate stage 06/03/2020   Type 2 diabetes mellitus with other specified complication (HCC) 06/03/2020   Nonrheumatic tricuspid valve regurgitation 02/27/2020   Essential hypertension 11/28/2019   Coronary artery disease involving native coronary artery of native heart without angina pectoris 11/28/2019   History of syncope 11/28/2019   Past Surgical History:  Procedure Laterality Date   HIP SURGERY     HYSTEROTOMY     LEFT HEART CATH AND CORONARY ANGIOGRAPHY N/A 02/24/2021   Procedure: LEFT HEART CATH AND CORONARY ANGIOGRAPHY;  Surgeon: Elder Negus, MD;  Location: [facility];  Service:  Cardiovascular;  Laterality: N/A;   Current Outpatient Medications on File Prior to Visit  Medication Sig Dispense Refill   acetaminophen (TYLENOL) 325 MG tablet Take 325 mg by mouth every 6 (six) hours as needed for mild pain or headache.     atorvastatin (LIPITOR) 40 MG tablet Take 40 mg by mouth at bedtime.     Continuous Blood Gluc Receiver (FREESTYLE LIBRE 14 DAY READER) DEVI See admin instructions.     Continuous Blood Gluc Sensor (FREESTYLE LIBRE 2 SENSOR) MISC Inject 1 Device into the skin every 14 (fourteen) days.     dorzolamide-timolol (COSOPT) 22.3-6.8 MG/ML ophthalmic solution Place 1 drop into both eyes in the morning and at bedtime.     ELIQUIS 2.5 MG TABS tablet Take 2.5 mg by mouth 2 (two) times daily.     latanoprost (XALATAN) 0.005 % ophthalmic solution Place 1 drop into both eyes at bedtime.     lisinopril (ZESTRIL) 10 MG tablet 1 tablet     metoprolol tartrate (LOPRESSOR) 25 MG tablet Take 1 tablet (25 mg total) by mouth 2 (two) times daily. 60 tablet 2   Netarsudil Dimesylate (RHOPRESSA) 0.02 % SOLN 1 drop into affected eye in the evening (Patient not taking: Reported on 02/23/2021)     No current facility-administered medications on file prior to visit.    Allergies  Allergen Reactions   Codeine Nausea Only and Other (See Comments)    Severe nausea   Gabapentin Other (See Comments)    Drowsiness   Naloxone Other (See Comments)    Patient disputes this in 2022   Nitrofurantoin Nausea Only and  Other (See Comments)    Severe nausea and made the patient "feel odd"   Norco [Hydrocodone-Acetaminophen] Other (See Comments)    Made the patient "feel odd"   Sulfa Antibiotics Other (See Comments)    Reaction not exactly recalled, but it DID cause an issue   Social History   Occupational History   Not on file  Tobacco Use   Smoking status: Former    Packs/day: 0.25    Years: 0.50    Pack years: 0.13    Types: Cigarettes   Smokeless tobacco: Never  Vaping Use    Vaping Use: Never used  Substance and Sexual Activity   Alcohol use: Yes    Comment: occ   Drug use: Never   Sexual activity: Not Currently   Family History  Problem Relation Age of Onset   Hypertension Sister     There is no immunization history on file for this patient.   Review of Systems: Negative except as noted in the HPI.   Objective: There were no vitals filed for this visit.  Joanne Hayden is a pleasant 81 y.o. female in NAD. AAO X 3.  Vascular Examination: CFT <3 seconds b/l LE. Faintly palpable pedal pulses b/l. No pain with calf compression b/l. Lower extremity skin temperature gradient within normal limits. No edema noted b/l LE. No cyanosis or clubbing noted b/l LE.  Dermatological Examination: Pedal integument with normal turgor, texture and tone BLE. No open wounds b/l LE. No interdigital macerations noted b/l LE. Toenails 1-5 b/l elongated, discolored, dystrophic, thickened, crumbly with subungual debris and tenderness to dorsal palpation. Hyperkeratotic lesion(s) L hallux.  No erythema, no edema, no drainage, no fluctuance. Porokeratotic lesion(s) submet head 3 right foot. No erythema, no edema, no drainage, no fluctuance.  Neurological Examination: Protective sensation decreased with 10 gram monofilament b/l. Vibratory sensation intact b/l.  Musculoskeletal Examination: Noted disuse atrophy bilaterally. Hammertoe deformity noted 2-5 b/l. Utilizes motorized chair for mobility assistance.  Footwear Assessment: Does the patient wear appropriate shoes? Yes. Does the patient need inserts/orthotics? Yes.  Assessment: 1. Pain due to onychomycosis of toenails of both feet   2. Callus   3. Porokeratosis   4. Multiple sclerosis (HCC)   5. Diabetic peripheral neuropathy associated with type 2 diabetes mellitus (HCC)   6. Encounter for diabetic foot exam (HCC)     ADA Risk Categorization:  High Risk  Patient has one or more of the following: Loss of  protective sensation Absent pedal pulses Severe Foot deformity History of foot ulcer Plan: -Patient was evaluated and treated. All patient's and/or POA's questions/concerns answered on today's visit. -I will have Orthotics & Prosthetics Department follow up with her on status of her diabetic shoes/custom inserts. -Diabetic foot examination performed today. -Continue foot and shoe inspections daily. Monitor blood glucose per PCP/Endocrinologist's recommendations. -Patient to continue soft, supportive shoe gear daily. -Mycotic toenails 1-5 bilaterally were debrided in length and girth with sterile nail nippers and dremel without incident. -Callus(es) L hallux pared utilizing sterile scalpel blade without complication or incident. Total number debrided =1. -Porokeratotic lesion(s) submet head 3 right foot pared and enucleated with sterile scalpel blade without incident. Total number of lesions debrided=1. -Patient/POA to call should there be question/concern in the interim. Return in about 3 months (around 12/02/2021).  Freddie Breech, DPM

## 2021-10-19 DIAGNOSIS — I10 Essential (primary) hypertension: Secondary | ICD-10-CM | POA: Diagnosis not present

## 2021-10-19 DIAGNOSIS — M810 Age-related osteoporosis without current pathological fracture: Secondary | ICD-10-CM | POA: Diagnosis not present

## 2021-10-19 DIAGNOSIS — I480 Paroxysmal atrial fibrillation: Secondary | ICD-10-CM | POA: Diagnosis not present

## 2021-10-19 DIAGNOSIS — I2510 Atherosclerotic heart disease of native coronary artery without angina pectoris: Secondary | ICD-10-CM | POA: Diagnosis not present

## 2021-10-19 DIAGNOSIS — E1169 Type 2 diabetes mellitus with other specified complication: Secondary | ICD-10-CM | POA: Diagnosis not present

## 2021-10-19 DIAGNOSIS — E785 Hyperlipidemia, unspecified: Secondary | ICD-10-CM | POA: Diagnosis not present

## 2021-11-12 DIAGNOSIS — I480 Paroxysmal atrial fibrillation: Secondary | ICD-10-CM | POA: Diagnosis not present

## 2021-11-12 DIAGNOSIS — I2510 Atherosclerotic heart disease of native coronary artery without angina pectoris: Secondary | ICD-10-CM | POA: Diagnosis not present

## 2021-11-12 DIAGNOSIS — M810 Age-related osteoporosis without current pathological fracture: Secondary | ICD-10-CM | POA: Diagnosis not present

## 2021-11-12 DIAGNOSIS — E1169 Type 2 diabetes mellitus with other specified complication: Secondary | ICD-10-CM | POA: Diagnosis not present

## 2021-11-12 DIAGNOSIS — E785 Hyperlipidemia, unspecified: Secondary | ICD-10-CM | POA: Diagnosis not present

## 2021-11-12 DIAGNOSIS — I10 Essential (primary) hypertension: Secondary | ICD-10-CM | POA: Diagnosis not present

## 2021-12-15 ENCOUNTER — Ambulatory Visit: Payer: Medicare Other | Admitting: Podiatry

## 2021-12-15 ENCOUNTER — Encounter: Payer: Self-pay | Admitting: Podiatry

## 2021-12-15 DIAGNOSIS — Q828 Other specified congenital malformations of skin: Secondary | ICD-10-CM

## 2021-12-15 DIAGNOSIS — M79675 Pain in left toe(s): Secondary | ICD-10-CM

## 2021-12-15 DIAGNOSIS — L84 Corns and callosities: Secondary | ICD-10-CM | POA: Diagnosis not present

## 2021-12-15 DIAGNOSIS — E1142 Type 2 diabetes mellitus with diabetic polyneuropathy: Secondary | ICD-10-CM | POA: Diagnosis not present

## 2021-12-15 DIAGNOSIS — M79674 Pain in right toe(s): Secondary | ICD-10-CM

## 2021-12-15 DIAGNOSIS — B351 Tinea unguium: Secondary | ICD-10-CM | POA: Diagnosis not present

## 2021-12-15 DIAGNOSIS — G35 Multiple sclerosis: Secondary | ICD-10-CM | POA: Diagnosis not present

## 2021-12-19 NOTE — Progress Notes (Signed)
  Subjective:  Patient ID: Joanne Hayden, female    DOB: Apr 20, 1940,  MRN: [id number]  Joanne Hayden presents to clinic today for at risk foot care with history of diabetic neuropathy and callus(es) left lower extremity, porokeratotic lesion(s) right lower extremity, and painful mycotic nails. Painful toenails interfere with ambulation. Aggravating factors include wearing enclosed shoe gear. Pain is relieved with periodic professional debridement. Painful callus(es) and porokeratotic lesion(s) are aggravated when weightbearing with and without shoegear. Pain is relieved with periodic professional debridement.  Last known HgA1c was unknown. Patient did not check blood glucose this morning.  New problem(s): None.   PCP is [name], MD , and last visit was  Aug 04, 2021.  Allergies  Allergen Reactions   Codeine Nausea Only and Other (See Comments)    Severe nausea   Gabapentin Other (See Comments)    Drowsiness   Naloxone Other (See Comments)    Patient disputes this in 2022   Nitrofurantoin Nausea Only and Other (See Comments)    Severe nausea and made the patient "feel odd"   Norco [Hydrocodone-Acetaminophen] Other (See Comments)    Made the patient "feel odd"   Sulfa Antibiotics Other (See Comments)    Reaction not exactly recalled, but it DID cause an issue    Review of Systems: Negative except as noted in the HPI.  Objective: No changes noted in today's physical examination. Joanne Hayden is a pleasant 81 y.o. female in NAD. AAO x 3.  Joanne Hayden is a pleasant 81 y.o. female in NAD. AAO X 3.  Vascular Examination: CFT <3 seconds b/l LE. Faintly palpable pedal pulses b/l. No pain with calf compression b/l. Lower extremity skin temperature gradient within normal limits. No edema noted b/l LE. No cyanosis or clubbing noted b/l LE.  Dermatological Examination: Pedal integument with normal turgor, texture and tone BLE. No open wounds b/l LE. No interdigital  macerations noted b/l LE. Toenails 1-5 b/l elongated, discolored, dystrophic, thickened, crumbly with subungual debris and tenderness to dorsal palpation. Hyperkeratotic lesion(s) L hallux.  No erythema, no edema, no drainage, no fluctuance. Porokeratotic lesion(s) submet head 3 right foot. No erythema, no edema, no drainage, no fluctuance.  Neurological Examination: Protective sensation decreased with 10 gram monofilament b/l. Vibratory sensation intact b/l.  Musculoskeletal Examination: Noted disuse atrophy bilaterally. Hammertoe deformity noted 2-5 b/l. Utilizes motorized chair for mobility assistance.  Assessment/Plan: 1. Pain due to onychomycosis of toenails of both feet   2. Callus   3. Porokeratosis   4. Multiple sclerosis ([place])   5. Diabetic peripheral neuropathy associated with type 2 diabetes mellitus ([place])   -Examined patient. -Stressed the importance of good glycemic control and the detriment of not  controlling glucose levels in relation to the foot. -Patient to continue soft, supportive shoe gear daily. -Toenails 1-5 b/l were debrided in length and girth with sterile nail nippers and dremel without iatrogenic bleeding.  -Callus(es) left great toe pared utilizing sterile scalpel blade without complication or incident. Total number debrided =1. -Porokeratotic lesion(s) submet head 3 right foot pared and enucleated with sterile scalpel blade without incident. Total number of lesions debrided=1. -Patient/POA to call should there be question/concern in the interim.   Return in about 3 months (around 03/16/2022).  Marzetta Board, DPM

## 2022-01-13 NOTE — Progress Notes (Signed)
NEUROLOGY FOLLOW UP OFFICE NOTE  JALAILA CARADONNA [id number]  Assessment/Plan:   Multiple sclerosis  1  Home health assessment.  I do think she needs a hospital bed. 2. Denies pain that she feels require medication (such as gabapentin) 3  Follow up one year   Subjective:  Joanne Hayden is a 81 year old right-handed female with CHF, CAD s/p MI, diabetes mellitus, HTN and HLD who follows up for multiple sclerosis.     UPDATE: Current DMT:  None Current medications:  Eliquis, lisinopril, metoprolol, atorvastatin 40mg    Not taking gabapentin.  She doesn't want to deal with potential side effects.  Vision:  S/p cataract surgery.  Uses reading glasses. Motor:  Generalized weakness.   Sensory/Pain:  Generalized back pain.  Left ankle aches.   Gait:  Uses motorized wheelchair outside home and manual wheelchair in the house.   Bowel/Bladder:  No issues Fatigue:  Chronic Cognition:  Memory okay Mood:  ok She explains that she has significant pain in her neck, back, shoulders and legs.    She finds it difficult to get around at home.  She has family, such as her niece, who will come by the house to help her (such as with bathing).  However, it has become much less frequent.     HISTORY: She was diagnosed with multiple sclerosis in 1985.  Around 1980 she had numbness in right toe that traveled up to her right knee lasting 2-3 weeks. She had a recurrent episode about a year later but the numbness traveled up her thigh.  It occurred a third time and she was referred to neurology when she had an MRI of the brain that revealed an MS plaque on the right cerebral hemisphere.  She also had abnormal CSF analysis.  She was started on Avonex for 30 years until 2021.  She stopped because she had an MI in 2020.  While on Avonex, she continued to have episodes of numbness and tingling.  Gradually she developed increased weakness in the legs and difficulty ambulating.      Past DMT:  Avonex (took for  30 years, stopped April 2021)   She reports episodes of losing consciousness.  No preceding headache, lightheadedness/dizziness, diaphoresis, chest pain, shortness of breath, or palpitations.   No warning.  Never witnessed.  She is unconscious for brief period.  The first time it occurred, it was after an Avonex injection.  She started having full body shaking and lowered herself to the floor.  She doesn't remember if she had passed out but she realized she had urinated but no tongue biting.  She had two subsequent episodes not preceded by shaking.  The last episode occurred after her [place] shot in April 2021.  When she woke up on the floor, she did not have incontinence or tongue biting.  EEG on 01/24/2020 was normal.  PAST MEDICAL HISTORY: Past Medical History:  Diagnosis Date   CHF (congestive heart failure) (HCC)    Diabetes mellitus without complication ([place])    Heart attack ([place])    Hyperlipidemia    Hypertension    Multiple sclerosis ([place])     MEDICATIONS: Current Outpatient Medications on File Prior to Visit  Medication Sig Dispense Refill   acetaminophen (TYLENOL) 325 MG tablet Take 325 mg by mouth every 6 (six) hours as needed for mild pain or headache.     atorvastatin (LIPITOR) 40 MG tablet Take 40 mg by mouth at bedtime.     Continuous  Blood Gluc Receiver (FREESTYLE LIBRE 14 DAY READER) DEVI See admin instructions.     Continuous Blood Gluc Sensor (FREESTYLE LIBRE 2 SENSOR) MISC Inject 1 Device into the skin every 14 (fourteen) days.     dorzolamide-timolol (COSOPT) 22.3-6.8 MG/ML ophthalmic solution Place 1 drop into both eyes in the morning and at bedtime.     ELIQUIS 2.5 MG TABS tablet Take 2.5 mg by mouth 2 (two) times daily.     latanoprost (XALATAN) 0.005 % ophthalmic solution Place 1 drop into both eyes at bedtime.     lisinopril (ZESTRIL) 10 MG tablet 1 tablet     metoprolol tartrate (LOPRESSOR) 25 MG tablet Take 1 tablet (25 mg total) by mouth 2 (two) times  daily. 60 tablet 2   Netarsudil Dimesylate (RHOPRESSA) 0.02 % SOLN 1 drop into affected eye in the evening (Patient not taking: Reported on 02/23/2021)     No current facility-administered medications on file prior to visit.    ALLERGIES: Allergies  Allergen Reactions   Codeine Nausea Only and Other (See Comments)    Severe nausea   Gabapentin Other (See Comments)    Drowsiness   Naloxone Other (See Comments)    Patient disputes this in 2022   Nitrofurantoin Nausea Only and Other (See Comments)    Severe nausea and made the patient "feel odd"   Norco [Hydrocodone-Acetaminophen] Other (See Comments)    Made the patient "feel odd"   Sulfa Antibiotics Other (See Comments)    Reaction not exactly recalled, but it DID cause an issue    FAMILY HISTORY: Family History  Problem Relation Age of Onset   Hypertension Sister       Objective:  Blood pressure 120/79, pulse 67, height 5\' 2"  (1.575 m), weight 100 lb (45.4 kg), SpO2 97 %. General: No acute distress.  Patient appears well-groomed.   Head:  Normocephalic/atraumatic Eyes:  Fundi examined but not visualized Neck: supple, no paraspinal tenderness, full range of motion Heart:  Regular rate and rhythm Neurological Exam: alert and oriented to person, place, and time.  Speech fluent and not dysarthric, language intact.  CN II-XII intact. Bulk and tone normal, muscle strength 4+/5 bilateral upper extremities except 3+ grip bilaterally, 2/5 bilateral hip flexion, 3+/5 right knee extension/flexion, right foot drop, otherwise /5 throughout.  Deep tendon reflexes 3+ throughout  Finger to nose testing intact.  Nonambulatory  , DO  CC: Shon Millet, MD

## 2022-01-18 ENCOUNTER — Encounter: Payer: Self-pay | Admitting: Neurology

## 2022-01-18 ENCOUNTER — Ambulatory Visit: Payer: Medicare Other | Admitting: Neurology

## 2022-01-18 VITALS — BP 120/79 | HR 67 | Ht 62.0 in | Wt 100.0 lb

## 2022-01-18 DIAGNOSIS — G35 Multiple sclerosis: Secondary | ICD-10-CM

## 2022-01-18 DIAGNOSIS — M5412 Radiculopathy, cervical region: Secondary | ICD-10-CM

## 2022-01-18 NOTE — Patient Instructions (Signed)
Home health assessment.

## 2022-01-18 NOTE — Addendum Note (Signed)
Addended by: Venetia Night on: 01/18/2022 02:46 PM   Modules accepted: Orders

## 2022-01-21 ENCOUNTER — Telehealth: Payer: Self-pay | Admitting: Neurology

## 2022-01-21 DIAGNOSIS — G35 Multiple sclerosis: Secondary | ICD-10-CM

## 2022-01-21 DIAGNOSIS — M5412 Radiculopathy, cervical region: Secondary | ICD-10-CM

## 2022-01-21 NOTE — Telephone Encounter (Signed)
Patient called and left a VM requesting a call from [facility]. She has questions for her.

## 2022-02-16 DIAGNOSIS — I2510 Atherosclerotic heart disease of native coronary artery without angina pectoris: Secondary | ICD-10-CM | POA: Diagnosis not present

## 2022-02-16 DIAGNOSIS — R262 Difficulty in walking, not elsewhere classified: Secondary | ICD-10-CM | POA: Diagnosis not present

## 2022-02-16 DIAGNOSIS — Z0000 Encounter for general adult medical examination without abnormal findings: Secondary | ICD-10-CM | POA: Diagnosis not present

## 2022-02-16 DIAGNOSIS — I252 Old myocardial infarction: Secondary | ICD-10-CM | POA: Diagnosis not present

## 2022-02-16 DIAGNOSIS — R54 Age-related physical debility: Secondary | ICD-10-CM | POA: Diagnosis not present

## 2022-02-16 DIAGNOSIS — E1169 Type 2 diabetes mellitus with other specified complication: Secondary | ICD-10-CM | POA: Diagnosis not present

## 2022-02-16 DIAGNOSIS — E440 Moderate protein-calorie malnutrition: Secondary | ICD-10-CM | POA: Diagnosis not present

## 2022-02-16 DIAGNOSIS — Z23 Encounter for immunization: Secondary | ICD-10-CM | POA: Diagnosis not present

## 2022-02-16 DIAGNOSIS — G35 Multiple sclerosis: Secondary | ICD-10-CM | POA: Diagnosis not present

## 2022-02-16 DIAGNOSIS — H401134 Primary open-angle glaucoma, bilateral, indeterminate stage: Secondary | ICD-10-CM | POA: Diagnosis not present

## 2022-02-16 DIAGNOSIS — M810 Age-related osteoporosis without current pathological fracture: Secondary | ICD-10-CM | POA: Diagnosis not present

## 2022-02-16 DIAGNOSIS — E785 Hyperlipidemia, unspecified: Secondary | ICD-10-CM | POA: Diagnosis not present

## 2022-02-22 ENCOUNTER — Ambulatory Visit: Payer: Medicare Other | Admitting: Cardiology

## 2022-02-22 ENCOUNTER — Encounter: Payer: Self-pay | Admitting: Cardiology

## 2022-02-22 VITALS — BP 124/59 | HR 70 | Resp 16 | Ht 62.0 in | Wt 100.0 lb

## 2022-02-22 DIAGNOSIS — I25118 Atherosclerotic heart disease of native coronary artery with other forms of angina pectoris: Secondary | ICD-10-CM | POA: Diagnosis not present

## 2022-02-22 MED ORDER — NITROGLYCERIN 0.4 MG SL SUBL: 0.4000 mg | SUBLINGUAL_TABLET | SUBLINGUAL | 3 refills | Status: DC | PRN

## 2022-02-22 NOTE — Progress Notes (Signed)
Patient referred by [name],* for coronary artery disease with history of MI.   Subjective:   Joanne Hayden, female    DOB: [birth year], 81 y.o.   MRN: [id number]   Chief Complaint  Patient presents with   Coronary Artery Disease   Follow-up    1 year    HPI   Joanne Hayden is 81 y.o. AA female with hypertension, hyperlipidemia, type 2 diabetes mellitus, CAD s/p PCI, PAF, multiple sclerosis, primary open-angle glaucoma.  Patient had an episode of Afib in 02/2021. She has not had any recurrence of palpitations since then. She has occasional chest pain symptoms. Unrelated to exertion, lasting for a few min.  2020: CAD: NSTEMI, LAD (2.75X18 mm & 3.0X12 mm Xience pLAD) and Lcx (2.5X28 mm & 2.75X33 mm Xience) PCI PAF On Eliquis and plavix then Normal LVEF, Mild AI, mild TR  Patient came for regular visit today.  In the process, she mentioned retrosternal chest discomfort, as well as tingling down her left arm.  EKG showed A. fib with RVR around 130 bpm.  Patient has MS, ambulates in wheelchair.  Today, she had a transportation service Doppler to her office visit.  She does not have any family immediately available.  Son, Jojo, was available over the phone and is aware.  Current Outpatient Medications:    acetaminophen (TYLENOL) 325 MG tablet, Take 325 mg by mouth every 6 (six) hours as needed for mild pain or headache., Disp: , Rfl:    atorvastatin (LIPITOR) 40 MG tablet, Take 40 mg by mouth at bedtime., Disp: , Rfl:    Continuous Blood Gluc Receiver (FREESTYLE LIBRE 47 DAY READER) DEVI, See admin instructions., Disp: , Rfl:    Continuous Blood Gluc Sensor (FREESTYLE LIBRE 2 SENSOR) MISC, Inject 1 Device into the skin every 14 (fourteen) days., Disp: , Rfl:    dorzolamide-timolol (COSOPT) 22.3-6.8 MG/ML ophthalmic solution, Place 1 drop into both eyes in the morning and at bedtime., Disp: , Rfl:    ELIQUIS 2.5 MG TABS tablet, Take 2.5 mg by mouth 2 (two) times  daily., Disp: , Rfl:    latanoprost (XALATAN) 0.005 % ophthalmic solution, Place 1 drop into both eyes at bedtime., Disp: , Rfl:    lisinopril (ZESTRIL) 10 MG tablet, 1 tablet, Disp: , Rfl:    metoprolol tartrate (LOPRESSOR) 25 MG tablet, Take 1 tablet (25 mg total) by mouth 2 (two) times daily., Disp: 60 tablet, Rfl: 2   Netarsudil Dimesylate (RHOPRESSA) 0.02 % SOLN, , Disp: , Rfl:    Cardiovascular and other pertinent studies:  Echocardiogram 02/24/2021: 1. Left ventricular ejection fraction, by estimation, is >75%. The left  ventricle has hyperdynamic function. The left ventricle has no regional  wall motion abnormalities. Left ventricular diastolic parameters were  normal.   2. Right ventricular systolic function is low normal. The right  ventricular size is not well visualized. There is normal pulmonary artery  systolic pressure.   3. Right atrial size was moderately dilated.   4. The mitral valve is grossly normal. No evidence of mitral valve  regurgitation.   5. Tricuspid valve regurgitation is moderate to severe. Estimated PASP 27  mmHg.   6. The aortic valve is tricuspid. Aortic valve regurgitation is trivial.   7. The inferior vena cava is normal in size with <50% respiratory  variability, suggesting right atrial pressure of 8 mmHg.   EKG 02/22/2022: Sinus rhythm 69 bpm  Diffuse low voltage Nonspecific ST-T abnormality  Lower Extremity Venous Duplex (  bilateral) 02/15/2020:  No evidence of deep vein thrombosis of the lower extremities with normal  venous return.  VQ scan 02/12/2020: No evidence acute pulmonary embolism.  Lexiscan Tetrofosmin Stress Test  01/09/2020: Nondiagnostic ECG stress. Resting EKG/ECG demonstrated normal sinus rhythm. Observed anteroseptal infarct. No ST-T wave abnormalities.  Peak EKG/ECG revealed no ST-T wave abnormalities. Rare PACs and PVCs.  Myocardial perfusion is normal. Although the TID ratio 1.47 is abnormal, visually I do not agree with  the findings and LV appears to be normal/small sized in both rest and stress images.  LV end-diastolic volume was 50 mL. Overall LV systolic function is normal without regional wall motion abnormalities. Stress LV EF: 61%.  No previous exam available for comparison. Low risk.   Echocardiogram 12/20/2019:  Left ventricle cavity is normal in size. Moderate concentric hypertrophy  of the left ventricle. Normal global wall motion. Normal LV systolic  function with EF 55%. Doppler evidence of grade I (impaired) diastolic  dysfunction, normal LAP.  Left atrial cavity is slightly dilated. Aneurysmal interatrial septum  without 2D or color Doppler evidence of interatrial shunt.  Right atrial cavity is moderately dilated.  Trileaflet aortic valve.  Moderate (Grade II) aortic regurgitation.  Moderate (Grade II) mitral regurgitation.  Severe tricuspid regurgitation. Estimated pulmonary artery systolic  pressure 25 mmHg.  Recent labs: 02/04/2021: Glucose 81, BUN/Cr 16/0.5. EGFR 92. HbA1C 6.5% Chol 102, TG 33, HDL 52, LDL 41 TSH N/A  10/15/2019: Glucose 93, BUN/Cr 13/0.65. EGFR 88. Na/K 141/4.8. Rest of the CMP normal H/H 13/39. MCV 91. Platelets 147 Chol 96, TG 32, HDL 51, LDL 36 No TSH or A1C available.   Review of Systems  Cardiovascular:  Positive for chest pain. Negative for claudication, dyspnea on exertion, leg swelling, orthopnea, palpitations and syncope.  Respiratory:  Negative for shortness of breath.   Musculoskeletal:  Positive for muscle weakness (Chronic).       Pain and tingling down left arm  Gastrointestinal:  Negative for melena.  Neurological:  Positive for weakness (Chronic). Negative for dizziness, light-headedness and numbness.        Vitals:   02/22/22 1156  BP: (!) 124/59  Pulse: 70  Resp: 16  SpO2: 95%     Body mass index is 18.29 kg/m.   Objective:   Physical Exam Constitutional:      Appearance: She is normal weight.     Comments: In wheelchair    Cardiovascular:     Rate and Rhythm: Normal rate and regular rhythm.     Pulses:          Radial pulses are 2+ on the right side and 2+ on the left side.       Dorsalis pedis pulses are 1+ on the right side and 1+ on the left side.       Posterior tibial pulses are 1+ on the right side and 1+ on the left side.     Heart sounds: Murmur heard.     Systolic murmur is present with a grade of 3/6 at the upper right sternal border.  Pulmonary:     Effort: Pulmonary effort is normal.     Breath sounds: Normal breath sounds.  Musculoskeletal:     Right lower leg: No edema.     Left lower leg: No edema.  Neurological:     Mental Status: She is alert.     Comments: Gait and balance limitations due to MS          Assessment &  Recommendations:   81 y.o. African American female with hypertension, hyperlipidemia, type 2 diabetes mellitus, CAD s/p PCI, PAF, multiple sclerosis, primary open-angle glaucoma, currently in Afib RVR w/angina  CAD: Occasional chest pain symptoms possibly angina. Will use SL NTG as needed.  Continue metoprolol tartrate 25 mg bid, atorvastatin, and lisinopril.  Will re-assess NTG use at next visit.  PAF: CHA2DS2VASc score 4, annual stroke risk 5% Continue Eliquis 2.5 mg bid  Echocardiogram and f/u in 4 weeks   Nigel Mormon, MD Pager: [phone] Office: [phone]

## 2022-03-17 ENCOUNTER — Encounter: Payer: Self-pay | Admitting: Podiatry

## 2022-03-17 ENCOUNTER — Ambulatory Visit: Payer: Medicare Other | Admitting: Podiatry

## 2022-03-17 DIAGNOSIS — M79675 Pain in left toe(s): Secondary | ICD-10-CM | POA: Diagnosis not present

## 2022-03-17 DIAGNOSIS — L84 Corns and callosities: Secondary | ICD-10-CM | POA: Diagnosis not present

## 2022-03-17 DIAGNOSIS — L819 Disorder of pigmentation, unspecified: Secondary | ICD-10-CM

## 2022-03-17 DIAGNOSIS — B351 Tinea unguium: Secondary | ICD-10-CM | POA: Diagnosis not present

## 2022-03-17 DIAGNOSIS — E1142 Type 2 diabetes mellitus with diabetic polyneuropathy: Secondary | ICD-10-CM

## 2022-03-17 DIAGNOSIS — M79674 Pain in right toe(s): Secondary | ICD-10-CM | POA: Diagnosis not present

## 2022-03-17 DIAGNOSIS — Q828 Other specified congenital malformations of skin: Secondary | ICD-10-CM | POA: Diagnosis not present

## 2022-03-21 NOTE — Progress Notes (Signed)
Subjective:  Patient ID: Joanne Hayden, female    DOB: Nov 06, 1940,  MRN: [id number]  Joanne Hayden presents to clinic today for at risk foot care with history of diabetic neuropathy and callus(es) left foot, porokeratotic lesion(s) right lower extremity, and painful mycotic nails. Painful toenails interfere with ambulation. Aggravating factors include wearing enclosed shoe gear. Pain is relieved with periodic professional debridement. Painful callus(es) and porokeratotic lesion(s) are aggravated when weightbearing with and without shoegear. Pain is relieved with periodic professional debridement.   She notes callus plantar aspect of right foot is painful. She has purchased OTC callus remover and used it on the lesion. She denies any redness, drainage or swelling from lesion, but it is painful when she places weight on her right foot to transfer back and forth from her motorized chair.  Chief Complaint  Patient presents with   Diabetes    Diabetic Foot Care, A1c- Not Known, BG- Not taking, PCP last seen month ago   New problem(s): She is concerned about discoloration on dorsal aspect of left foot. She may be unknowningly rubbing her right heel on top of the left  foot. She does not recall any swelling and does have numbness due to MS. It is not painful. Denies any open wound or drainage.   PCP is Lorenda Ishihara, MD.  Allergies  Allergen Reactions   Codeine Nausea Only and Other (See Comments)    Severe nausea   Gabapentin Other (See Comments)    Drowsiness   Naloxone Other (See Comments)    Patient disputes this in 2022   Nitrofurantoin Nausea Only and Other (See Comments)    Severe nausea and made the patient "feel odd"   Norco [Hydrocodone-Acetaminophen] Other (See Comments)    Made the patient "feel odd"   Sulfa Antibiotics Other (See Comments)    Reaction not exactly recalled, but it DID cause an issue   Review of Systems: Negative except as noted in the  HPI.  Objective:  There were no vitals filed for this visit.  Joanne Hayden is a pleasant 81 y.o. female thin build in NAD. AAO x 3.  Vascular Examination: CFT <3 seconds b/l LE. Faintly palpable pedal pulses b/l. No pain with calf compression b/l. Lower extremity skin temperature gradient within normal limits. No edema noted b/l LE. No cyanosis or clubbing noted b/l LE.   She does have mild hyperpigmentation dorsal aspect of left forefoot. No increased temperature nor abnormal coolness nor ischemia present. No tissue loss.   Dermatological Examination: Pedal integument with normal turgor, texture and tone BLE. No open wounds b/l LE. No interdigital macerations noted b/l LE.   Toenails 1-5 b/l elongated, discolored, dystrophic, thickened, crumbly with subungual debris and tenderness to dorsal palpation.   Hyperkeratotic lesion(s) L hallux.  No erythema, no edema, no drainage, no fluctuance.   Macerated porokeratotic lesion(s) submet head 3 right foot consistent with patient's h/o using OTC medicated corn/callus remover. No erythema, no edema, no drainage, no fluctuance.  Neurological Examination: Protective sensation decreased with 10 gram monofilament b/l. Vibratory sensation intact b/l.  Musculoskeletal Examination: Noted disuse atrophy bilaterally. Hammertoe deformity noted 2-5 b/l. Utilizes motorized chair for mobility assistance.  Assessment/Plan: 1. Pain due to onychomycosis of toenails of both feet   2. Callus   3. Porokeratosis   4. Diabetic peripheral neuropathy associated with type 2 diabetes mellitus (HCC)     No orders of the defined types were placed in this encounter.   -Patient was evaluated and  treated. All patient's and/or POA's questions/concerns answered on today's visit. -Counseled patient on the dangers of using OTC corn/callus remover. Patient states she will comply. For painful lesion right foot, applied dancer's pad to right foot insert to offload lesion.  For left foot, apply Neosporin to dorsum of left foot once daily. Call office if she notices any changes. -Toenails 1-5 b/l were debrided in length and girth with sterile nail nippers and dremel without iatrogenic bleeding.  -Callus(es) left great toe pared utilizing rotary bur without complication or incident. Total number pared =1. -Porokeratotic lesion(s) submet head 3 right foot pared and enucleated with sterile currette without incident. Total number of lesions debrided=1. -Patient/POA to call should there be question/concern in the interim.   Return in about 3 months (around 06/16/2022).  Freddie Breech, DPM

## 2022-04-04 IMAGING — CR DG CHEST 2V
2 series · 2 of 2 positions shown · non-contrast
Comparison: 02/12/2020

CLINICAL DATA: Chest pain

EXAM:
CHEST - 2 VIEW

[chest lat]
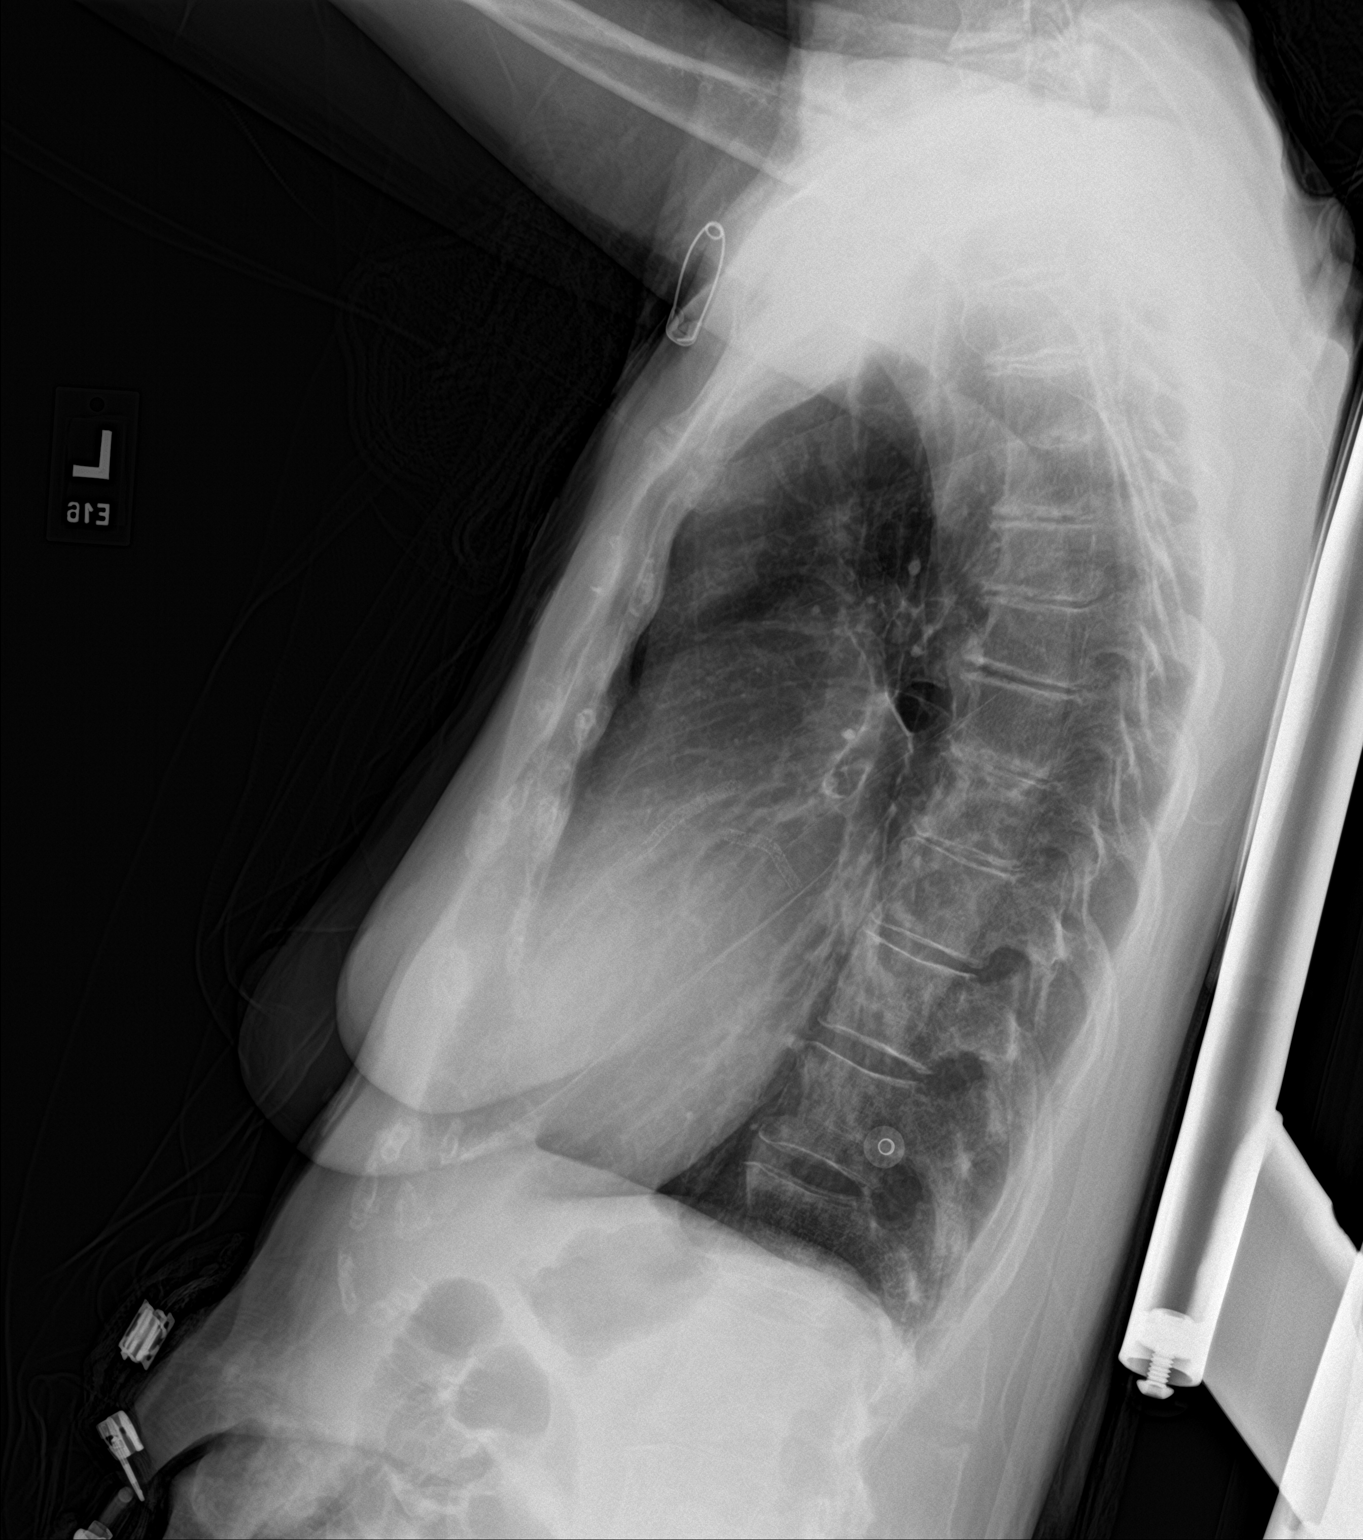

[chest ap]
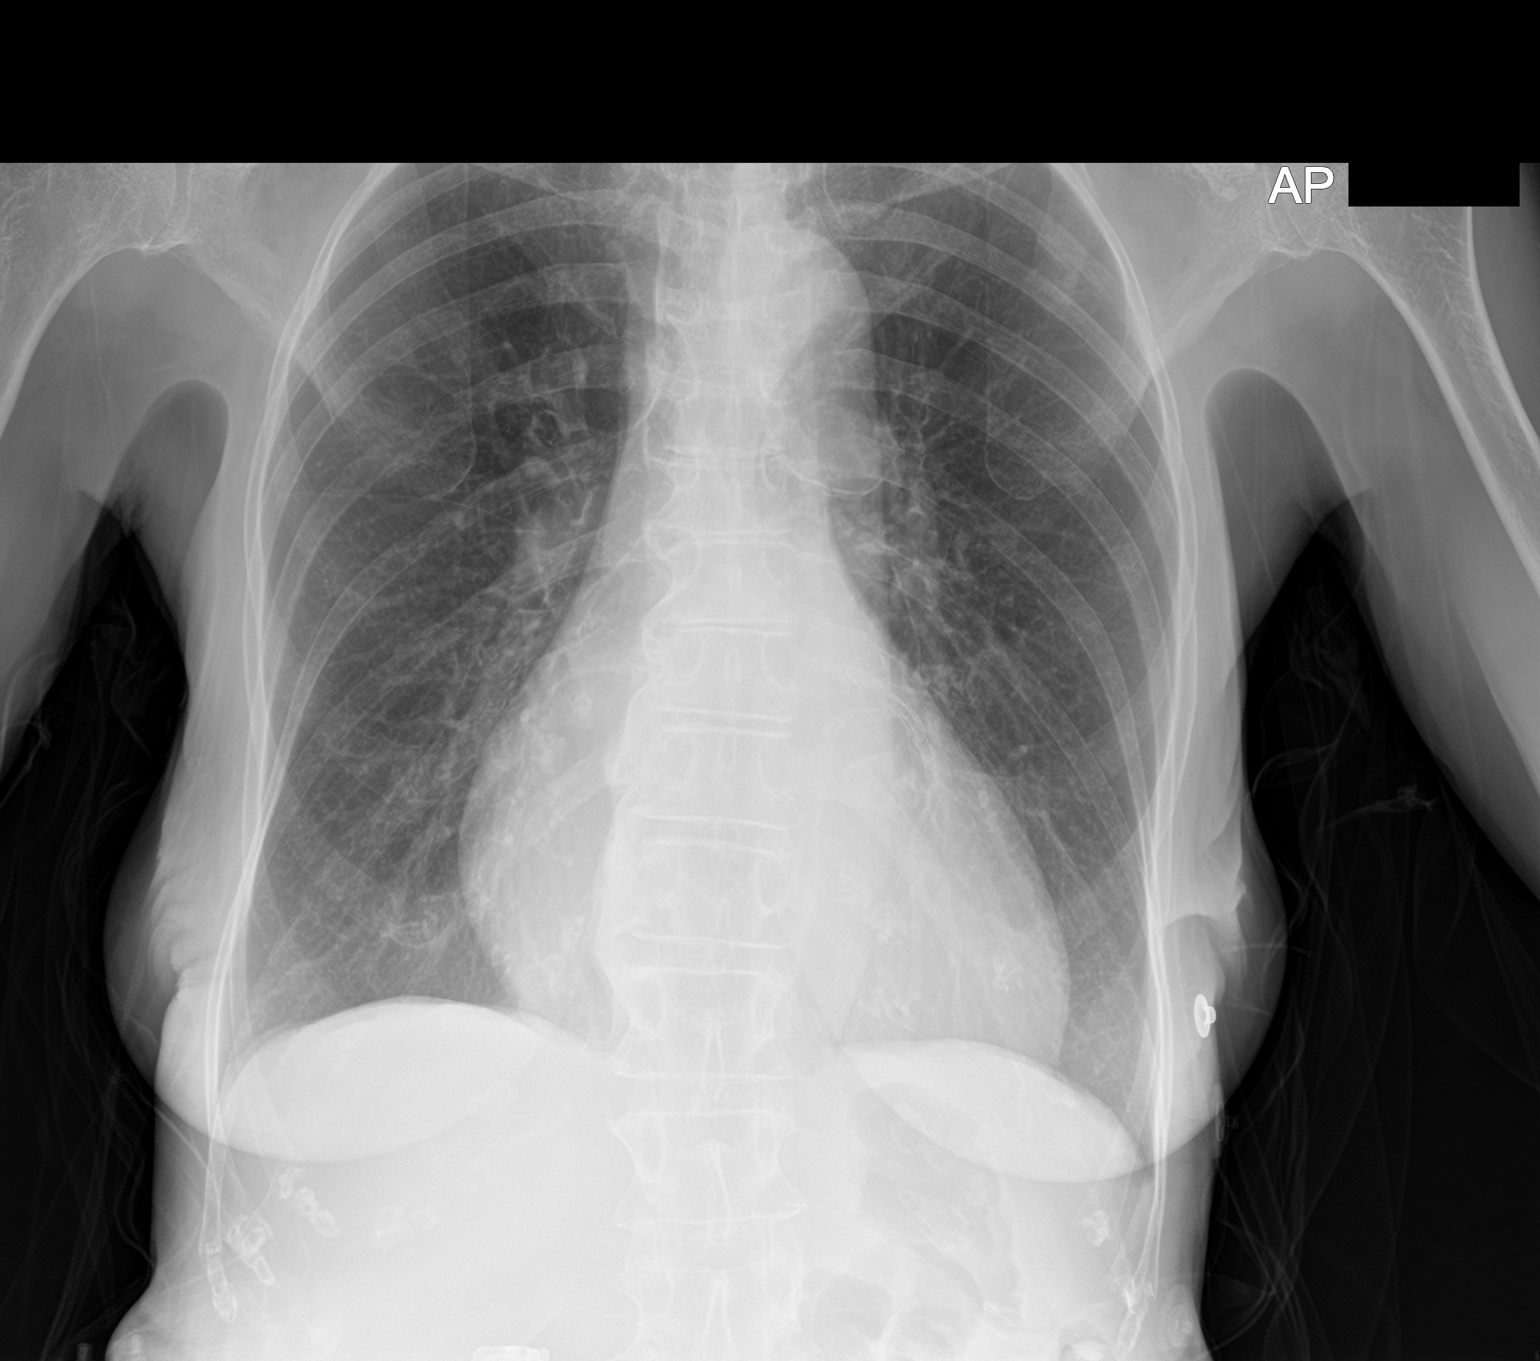

[2 of 2 positions shown; findings below may reference images not displayed]

FINDINGS: Mild cardiomegaly. Lungs clear. No effusions or edema. No acute bony
abnormality.
IMPRESSION: Cardiomegaly.  No active disease.

## 2022-05-07 ENCOUNTER — Ambulatory Visit: Payer: Medicare Other

## 2022-05-12 ENCOUNTER — Other Ambulatory Visit: Payer: Medicare Other

## 2022-05-12 ENCOUNTER — Ambulatory Visit: Payer: Medicare Other | Admitting: Cardiology

## 2022-05-20 ENCOUNTER — Other Ambulatory Visit: Payer: Medicare Other

## 2022-05-27 ENCOUNTER — Ambulatory Visit: Payer: Medicare Other | Admitting: Cardiology

## 2022-05-28 ENCOUNTER — Ambulatory Visit: Payer: Medicare Other | Admitting: Podiatry

## 2022-06-02 ENCOUNTER — Ambulatory Visit: Payer: Medicare Other | Admitting: Podiatry

## 2022-06-19 ENCOUNTER — Emergency Department (HOSPITAL_COMMUNITY): Payer: Medicare Other

## 2022-06-19 ENCOUNTER — Other Ambulatory Visit: Payer: Self-pay

## 2022-06-19 ENCOUNTER — Encounter (HOSPITAL_COMMUNITY): Payer: Self-pay

## 2022-06-19 ENCOUNTER — Emergency Department (HOSPITAL_COMMUNITY)
Admission: EM | Admit: 2022-06-19 | Discharge: 2022-06-19 | Disposition: A | Discharge status: Home / Self Care | Payer: Medicare Other | Attending: Emergency Medicine | Admitting: Emergency Medicine

## 2022-06-19 DIAGNOSIS — S72115A Nondisplaced fracture of greater trochanter of left femur, initial encounter for closed fracture: Secondary | ICD-10-CM | POA: Diagnosis not present

## 2022-06-19 DIAGNOSIS — T1490XA Injury, unspecified, initial encounter: Secondary | ICD-10-CM | POA: Diagnosis not present

## 2022-06-19 DIAGNOSIS — I10 Essential (primary) hypertension: Secondary | ICD-10-CM | POA: Insufficient documentation

## 2022-06-19 DIAGNOSIS — S0990XA Unspecified injury of head, initial encounter: Secondary | ICD-10-CM | POA: Insufficient documentation

## 2022-06-19 DIAGNOSIS — E119 Type 2 diabetes mellitus without complications: Secondary | ICD-10-CM | POA: Diagnosis not present

## 2022-06-19 DIAGNOSIS — W010XXA Fall on same level from slipping, tripping and stumbling without subsequent striking against object, initial encounter: Secondary | ICD-10-CM | POA: Diagnosis not present

## 2022-06-19 DIAGNOSIS — M25552 Pain in left hip: Secondary | ICD-10-CM | POA: Diagnosis not present

## 2022-06-19 DIAGNOSIS — I509 Heart failure, unspecified: Secondary | ICD-10-CM | POA: Insufficient documentation

## 2022-06-19 DIAGNOSIS — Z043 Encounter for examination and observation following other accident: Secondary | ICD-10-CM | POA: Diagnosis not present

## 2022-06-19 DIAGNOSIS — M25571 Pain in right ankle and joints of right foot: Secondary | ICD-10-CM | POA: Diagnosis not present

## 2022-06-19 DIAGNOSIS — S4992XA Unspecified injury of left shoulder and upper arm, initial encounter: Secondary | ICD-10-CM | POA: Diagnosis not present

## 2022-06-19 DIAGNOSIS — Z87891 Personal history of nicotine dependence: Secondary | ICD-10-CM | POA: Diagnosis not present

## 2022-06-19 DIAGNOSIS — S79912A Unspecified injury of left hip, initial encounter: Secondary | ICD-10-CM | POA: Diagnosis present

## 2022-06-19 DIAGNOSIS — G9389 Other specified disorders of brain: Secondary | ICD-10-CM | POA: Diagnosis not present

## 2022-06-19 DIAGNOSIS — W19XXXA Unspecified fall, initial encounter: Secondary | ICD-10-CM

## 2022-06-19 DIAGNOSIS — S72002A Fracture of unspecified part of neck of left femur, initial encounter for closed fracture: Secondary | ICD-10-CM | POA: Diagnosis not present

## 2022-06-19 DIAGNOSIS — I25118 Atherosclerotic heart disease of native coronary artery with other forms of angina pectoris: Secondary | ICD-10-CM

## 2022-06-19 NOTE — ED Provider Notes (Signed)
[facility] Provider Note  CSN: [id number] Arrival date & time: 06/19/22 1556  Chief Complaint(s) Fall  HPI Joanne Hayden is a 82 y.o. female with past medical history as below, significant for CHF, DM, HLD, HTN, MS who presents to the ED with complaint of fall.  Patient reports yesterday morning around 8 AM she was walking out reports reviewed her medications when she tripped and fell.  Fell onto her left side, hit her head on the door.  No LOC.  Was ambulatory after the event but had pain to her left hip.  Reduced ambulation in the past 24 hours from her baseline.  She is anticoagulated on Eliquis, last dose was this morning.  No nausea or vomiting, no numbness or tingling seems new, no behavior changes, no prodrome.  No LOC.  She is able to ambulate but has some discomfort to the lateral aspect of her left hip  Past Medical History Past Medical History:  Diagnosis Date   CHF (congestive heart failure) (HCC)    Diabetes mellitus without complication ([place])    Heart attack ([place])    Hyperlipidemia    Hypertension    Multiple sclerosis ([place])    Patient Active Problem List   Diagnosis Date Noted   PAF (paroxysmal atrial fibrillation) ([place]) 02/23/2021   Atrial fibrillation with rapid ventricular response ([place]) 02/23/2021   NSTEMI (non-ST elevated myocardial infarction) ([place]) 02/23/2021   Decreased estrogen level 12/03/2020   Abnormal gait 06/03/2020   Dyslipidemia 06/03/2020   Malnutrition of moderate degree ([place]: 60% to less than 75% of standard weight) ([place]) 06/03/2020   Multiple sclerosis ([place]) 06/03/2020   Primary open angle glaucoma (POAG) of both eyes, indeterminate stage 06/03/2020   Type 2 diabetes mellitus with other specified complication ([place]) 99991111   Nonrheumatic tricuspid valve regurgitation 02/27/2020   Essential hypertension 11/28/2019   Coronary artery disease involving native coronary artery of native heart  without angina pectoris 11/28/2019   History of syncope 11/28/2019   Home Medication(s) Prior to Admission medications   Medication Sig Start Date End Date Taking? Authorizing Provider  acetaminophen (TYLENOL) 325 MG tablet Take 325 mg by mouth every 6 (six) hours as needed for mild pain or headache.    [provider]  atorvastatin (LIPITOR) 40 MG tablet Take 40 mg by mouth at bedtime. 11/14/19   [provider]  Continuous Blood Gluc Receiver (FREESTYLE LIBRE 14 DAY READER) [place] See admin instructions. 09/29/20   [provider]  Continuous Blood Gluc Sensor (FREESTYLE LIBRE 2 SENSOR) MISC Inject 1 Device into the skin every 14 (fourteen) days.    [provider]  dorzolamide-timolol (COSOPT) 22.3-6.8 MG/ML ophthalmic solution Place 1 drop into both eyes in the morning and at bedtime. 11/14/19   [provider]  ELIQUIS 2.5 MG TABS tablet Take 2.5 mg by mouth 2 (two) times daily. 11/14/19   [provider]  latanoprost (XALATAN) 0.005 % ophthalmic solution Place 1 drop into both eyes at bedtime. 11/14/19   [provider]  lisinopril (ZESTRIL) 10 MG tablet 1 tablet    [provider]  metoprolol tartrate (LOPRESSOR) 25 MG tablet Take 1 tablet (25 mg total) by mouth 2 (two) times daily. 02/24/21   Patwardhan, Reynold Bowen, MD  Netarsudil Dimesylate (RHOPRESSA) 0.02 % SOLN     [provider]  nitroGLYCERIN (NITROSTAT) 0.4 MG SL tablet Place 1 tablet (0.4 mg total) under the tongue every 5 (five) minutes as needed  for chest pain. 02/22/22 05/23/22  Patwardhan, Reynold Bowen, MD                                                                                                                                    Past Surgical History Past Surgical History:  Procedure Laterality Date   HIP SURGERY     HYSTEROTOMY     LEFT HEART CATH AND CORONARY ANGIOGRAPHY N/A 02/24/2021   Procedure: LEFT HEART CATH AND CORONARY ANGIOGRAPHY;   Surgeon: Nigel Mormon, MD;  Location: [facility];  Service: Cardiovascular;  Laterality: N/A;   Family History Family History  Problem Relation Age of Onset   Hypertension Sister     Social History Social History   Tobacco Use   Smoking status: Former    Packs/day: 0.25    Years: 0.50    Additional pack years: 0.00    Total pack years: 0.13    Types: Cigarettes    Quit date: 1968    Years since quitting: 56.2   Smokeless tobacco: Never  Vaping Use   Vaping Use: Never used  Substance Use Topics   Alcohol use: Yes    Comment: occ   Drug use: Never   Allergies Codeine, Gabapentin, Naloxone, Nitrofurantoin, Norco [hydrocodone-acetaminophen], and Sulfa antibiotics  Review of Systems Review of Systems  Constitutional:  Negative for activity change and fever.  HENT:  Negative for facial swelling and trouble swallowing.   Eyes:  Negative for discharge and redness.  Respiratory:  Negative for cough and shortness of breath.   Cardiovascular:  Negative for chest pain and palpitations.  Gastrointestinal:  Negative for abdominal pain and nausea.  Genitourinary:  Negative for dysuria and flank pain.  Musculoskeletal:  Positive for arthralgias and gait problem. Negative for back pain.  Skin:  Negative for pallor and rash.  Neurological:  Positive for headaches. Negative for syncope.    Physical Exam Vital Signs  I have reviewed the triage vital signs BP (!) 157/83   Pulse 64   Temp 98.2 F (36.8 C) (Oral)   Resp 18   Ht 5\' 2"  (1.575 m)   Wt 49.9 kg   SpO2 97%   BMI 20.12 kg/m  Physical Exam Vitals and nursing note reviewed.  Constitutional:      General: She is not in acute distress.    Appearance: Normal appearance.     Comments: frail  HENT:     Head: Normocephalic and atraumatic. No raccoon eyes, Battle's sign, right periorbital erythema or left periorbital erythema.     Jaw: There is normal jaw occlusion.     Comments: No external evidence of  head trauma    Right Ear: External ear normal.     Left Ear: External ear normal.     Nose: Nose normal.     Mouth/Throat:     Mouth: Mucous membranes are moist.  Eyes:  General: No scleral icterus.       Right eye: No discharge.        Left eye: No discharge.  Cardiovascular:     Rate and Rhythm: Normal rate and regular rhythm.     Pulses: Normal pulses.     Heart sounds: Normal heart sounds.  Pulmonary:     Effort: Pulmonary effort is normal. No respiratory distress.     Breath sounds: Normal breath sounds.  Abdominal:     General: Abdomen is flat.     Palpations: Abdomen is soft.     Tenderness: There is no abdominal tenderness.  Musculoskeletal:       Arms:     Cervical back: No rigidity.     Right lower leg: No edema.     Left lower leg: No edema.       Legs:     Comments: No midline spinous process tenderness to palpation or percussion, no crepitus or step-off.    Pelvis stable to AP pressure  Bilateral upper and lower extremities NVI  Strength 5/5 bilateral upper and lower extremities symmetric  Bilateral Achilles, patellar and quadricep tendons intact  Pain is worsened with abduction of her hip L     Skin:    General: Skin is warm and dry.     Capillary Refill: Capillary refill takes less than 2 seconds.  Neurological:     Mental Status: She is alert and oriented to person, place, and time.     GCS: GCS eye subscore is 4. GCS verbal subscore is 5. GCS motor subscore is 6.     Cranial Nerves: Cranial nerves 2-12 are intact. No dysarthria.     Sensory: Sensation is intact.     Motor: Motor function is intact.     Coordination: Coordination is intact.     Comments: Gait not tested secondary to patient safety  Psychiatric:        Mood and Affect: Mood normal.        Behavior: Behavior normal.     ED Results and Treatments Labs (all labs ordered are listed, but only abnormal results are displayed) Labs Reviewed - No data to display                                                                                                                         Radiology DG Ankle Complete Right  Result Date: 06/19/2022 CLINICAL DATA:  Fall with ankle pain EXAM: RIGHT ANKLE - COMPLETE 3+ VIEW COMPARISON:  None Available. FINDINGS: Osseous demineralization. No definitive fracture or malalignment. Ankle mortise is symmetric. Vascular calcification IMPRESSION: Osseous demineralization. No acute osseous abnormality. Electronically Signed   By: Donavan Foil M.D.   On: 06/19/2022 17:57   DG Hip Unilat W or Wo Pelvis 2-3 Views Left  Result Date: 06/19/2022 CLINICAL DATA:  Fall left hip pain EXAM: DG HIP (WITH OR WITHOUT PELVIS) 2-3V LEFT COMPARISON:  None Available. FINDINGS: SI joints show mild degenerative change but  no widening. Pubic symphysis and rami appear intact. Threaded screw fixation of right proximal femur. No malalignment. Mild arthritis of the hips. Questionable nondisplaced fracture at the greater trochanter of the proximal left femur. IMPRESSION: 1. Questionable nondisplaced fracture at the greater trochanter of the proximal left femur. Consider further assessment with CT 2. Mild arthritis of the hips. Electronically Signed   By: Donavan Foil M.D.   On: 06/19/2022 17:56   DG Shoulder Left  Result Date: 06/19/2022 CLINICAL DATA:  Fall, trauma, pain EXAM: LEFT SHOULDER - 2+ VIEW COMPARISON:  06/19/2022 FINDINGS: Limited two-view exam. Bones are osteopenic. No gross malalignment, fracture, subluxation or dislocation. Minor AC joint degenerative change. IMPRESSION: Osteopenia and degenerative change. No acute finding by plain radiography. Electronically Signed   By: Jerilynn Mages.  Shick M.D.   On: 06/19/2022 17:47    Pertinent labs & imaging results that were available during my care of the patient were reviewed by me and considered in my medical decision making (see MDM for details).  Medications Ordered in ED Medications - No data to display                                                                                                                                    Procedures Procedures  (including critical care time)  Medical Decision Making / ED Course    Medical Decision Making:    BRIZEYDA SPARROW is a 83 y.o. female with past medical history as below, significant for CHF, DM, HLD, HTN, MS who presents to the ED with complaint of fall. . The complaint involves an extensive differential diagnosis and also carries with it a high risk of complications and morbidity.  Serious etiology was considered. Ddx includes but is not limited to: Differential diagnoses for head trauma includes subdural hematoma, epidural hematoma, acute concussion, traumatic subarachnoid hemorrhage, cerebral contusions, sprain, strain, fracture, soft tissue injury etc.   Complete initial physical exam performed, notably the patient  was no acute distress, neuroexam is nonfocal, HDS.    Reviewed and confirmed nursing documentation for past medical history, family history, social history.  Vital signs reviewed.    Clinical Course as of 06/19/22 2125  Sat Jun 19, 2022  1837 Hip XR with ?fx, will get CT [SG]  2004 Report not crossing over from PACS, pt w/ nondisplaced greater trochanter fx on left [SG]    Clinical Course User Index [SG] Jeanell Sparrow, DO   CT imaging reviewed, x-ray imaging reviewed.  Pain is mild.  Recommend nonweightbearing and follow-up with orthopedics in 1 week.  Son will help take care of her.  She has wheelchair at bedside which she uses at home when needed.  Offered prescription analgesics but she prefers to take Tylenol only for her hip pain.  [place] history [place]opedic [place], will give for local orthopedic follow-up on-call.  Lower extremities are NVI, DP pulses are equal  The  patient improved significantly and was discharged in stable condition. Detailed discussions were had with the patient regarding current  findings, and need for close f/u with PCP or on call doctor. The patient has been instructed to return immediately if the symptoms worsen in any way for re-evaluation. Patient verbalized understanding and is in agreement with current care plan. All questions answered prior to discharge.     Additional history obtained: -Additional history obtained from family -External records from outside source obtained and reviewed including: Chart review including previous notes, labs, imaging, consultation notes including primary care documentation, follows with Dr. Tomi Likens neurology.  Prior labs and imaging, medications   Lab Tests: na  EKG   EKG Interpretation  Date/Time:    Ventricular Rate:    PR Interval:    QRS Duration:   QT Interval:    QTC Calculation:   R Axis:     Text Interpretation:           Imaging Studies ordered: I ordered imaging studies including cxr/ct head c-spine left hip I independently visualized the following imaging with scope of interpretation limited to determining acute life threatening conditions related to emergency care: Chronic changes, greater trochanter fracture I independently visualized and interpreted imaging. I agree with the radiologist interpretation   Medicines ordered and prescription drug management: No orders of the defined types were placed in this encounter.   -I have reviewed the patients home medicines and have made adjustments as needed   Consultations Obtained: na   Cardiac Monitoring: na  Social Determinants of Health:  Diagnosis or treatment significantly limited by social determinants of health: former smoker   Reevaluation: After the interventions noted above, I reevaluated the patient and found that they have improved  Co morbidities that complicate the patient evaluation  Past Medical History:  Diagnosis Date   CHF (congestive heart failure) ([place])    Diabetes mellitus without complication ([place])    Heart attack  ([place])    Hyperlipidemia    Hypertension    Multiple sclerosis ([place])       Dispostion: Disposition decision including need for hospitalization was considered, and patient discharged from emergency department.    Final Clinical Impression(s) / ED Diagnoses Final diagnoses:  Fall, initial encounter  Closed nondisplaced fracture of greater trochanter of left femur, initial encounter [facility])     This chart was dictated using voice recognition software.  Despite best efforts to proofread,  errors can occur which can change the documentation meaning.    Jeanell Sparrow, DO 06/19/22 2125

## 2022-06-19 NOTE — ED Triage Notes (Signed)
States that she fell yesterday morning, states that she hit her head, left shoulder, left hip, leg, back and possible ankle. States that she is able to walk but it causes pain.  Denies LOC,    HX of MS States she is taking Eliquis daily

## 2022-06-19 NOTE — Discharge Instructions (Addendum)
Please call Dr. Dierdre Highman office on Monday to arrange follow-up for your hip fracture >>You have a fracture to the "greater trochanter" portion of you left hip. Recommend you not bear weight to your left hip for the next week. You may take tylenol as needed for hip pain. Please return if pain becomes severe and not improved with tylenol.   It was a pleasure caring for you today in the emergency department.  Please return to the emergency department for any worsening or worrisome symptoms.

## 2022-06-19 NOTE — ED Notes (Signed)
Pt is resting without acute distress at this time.

## 2022-06-24 DIAGNOSIS — M25552 Pain in left hip: Secondary | ICD-10-CM | POA: Diagnosis not present

## 2022-06-24 DIAGNOSIS — M67912 Unspecified disorder of synovium and tendon, left shoulder: Secondary | ICD-10-CM | POA: Diagnosis not present

## 2022-06-28 ENCOUNTER — Ambulatory Visit: Payer: Medicare Other | Admitting: Podiatry

## 2022-07-07 ENCOUNTER — Ambulatory Visit: Payer: Medicare Other | Admitting: Podiatry

## 2022-12-07 ENCOUNTER — Other Ambulatory Visit: Payer: Medicare PPO

## 2023-01-11 ENCOUNTER — Other Ambulatory Visit: Payer: Medicare PPO

## 2023-01-17 NOTE — Progress Notes (Deleted)
NEUROLOGY FOLLOW UP OFFICE NOTE  Joanne Hayden [id number]  Assessment/Plan:   Multiple sclerosis  1  ***   Subjective:  Joanne Hayden is a 82 year old right-handed female with CHF, CAD s/p MI, diabetes mellitus, HTN and HLD who follows up for multiple sclerosis.     UPDATE: Current DMT:  None Current medications:  Eliquis, lisinopril, metoprolol, atorvastatin 40mg    ***  Vision:  S/p cataract surgery.  Uses reading glasses. Motor:  Generalized weakness.   Sensory/Pain:  Generalized back pain.  Left ankle aches.   Gait:  Uses motorized wheelchair outside home and manual wheelchair in the house.   Bowel/Bladder:  No issues Fatigue:  Chronic Cognition:  Memory okay Mood:  ok She explains that she has significant pain in her neck, back, shoulders and legs.    She finds it difficult to get around at home.  She has family, such as her niece, who will come by the house to help her (such as with bathing).  However, it has become much less frequent.     HISTORY: She was diagnosed with multiple sclerosis in 1985.  Around 1980 she had numbness in right toe that traveled up to her right knee lasting 2-3 weeks. She had a recurrent episode about a year later but the numbness traveled up her thigh.  It occurred a third time and she was referred to neurology when she had an MRI of the brain that revealed an MS plaque on the right cerebral hemisphere.  She also had abnormal CSF analysis.  She was started on Avonex for 30 years until 2021.  She stopped because she had an MI in 2020.  While on Avonex, she continued to have episodes of numbness and tingling.  Gradually she developed increased weakness in the legs and difficulty ambulating.      Past DMT:  Avonex (took for 30 years, stopped April 2021)   She reports episodes of losing consciousness.  No preceding headache, lightheadedness/dizziness, diaphoresis, chest pain, shortness of breath, or palpitations.   No warning.  Never witnessed.   She is unconscious for brief period.  The first time it occurred, it was after an Avonex injection.  She started having full body shaking and lowered herself to the floor.  She doesn't remember if she had passed out but she realized she had urinated but no tongue biting.  She had two subsequent episodes not preceded by shaking.  The last episode occurred after her Moderna COVID shot in April 2021.  When she woke up on the floor, she did not have incontinence or tongue biting.  EEG on 01/24/2020 was normal.  PAST MEDICAL HISTORY: Past Medical History:  Diagnosis Date   CHF (congestive heart failure) (HCC)    Diabetes mellitus without complication (HCC)    Heart attack (HCC)    Hyperlipidemia    Hypertension    Multiple sclerosis (HCC)     MEDICATIONS: Current Outpatient Medications on File Prior to Visit  Medication Sig Dispense Refill   acetaminophen (TYLENOL) 325 MG tablet Take 325 mg by mouth every 6 (six) hours as needed for mild pain or headache.     atorvastatin (LIPITOR) 40 MG tablet Take 40 mg by mouth at bedtime.     Continuous Blood Gluc Receiver (FREESTYLE LIBRE 14 DAY READER) DEVI See admin instructions.     Continuous Blood Gluc Sensor (FREESTYLE LIBRE 2 SENSOR) MISC Inject 1 Device into the skin every 14 (fourteen) days.     dorzolamide-timolol (  COSOPT) 22.3-6.8 MG/ML ophthalmic solution Place 1 drop into both eyes in the morning and at bedtime.     ELIQUIS 2.5 MG TABS tablet Take 2.5 mg by mouth 2 (two) times daily.     latanoprost (XALATAN) 0.005 % ophthalmic solution Place 1 drop into both eyes at bedtime.     lisinopril (ZESTRIL) 10 MG tablet 1 tablet     metoprolol tartrate (LOPRESSOR) 25 MG tablet Take 1 tablet (25 mg total) by mouth 2 (two) times daily. 60 tablet 2   Netarsudil Dimesylate (RHOPRESSA) 0.02 % SOLN      nitroGLYCERIN (NITROSTAT) 0.4 MG SL tablet Place 1 tablet (0.4 mg total) under the tongue every 5 (five) minutes as needed for chest pain. 90 tablet 3    No current facility-administered medications on file prior to visit.    ALLERGIES: Allergies  Allergen Reactions   Codeine Nausea Only and Other (See Comments)    Severe nausea   Gabapentin Other (See Comments)    Drowsiness   Naloxone Other (See Comments)    Patient disputes this in 2022   Nitrofurantoin Nausea Only and Other (See Comments)    Severe nausea and made the patient "feel odd"   Norco [Hydrocodone-Acetaminophen] Other (See Comments)    Made the patient "feel odd"   Sulfa Antibiotics Other (See Comments)    Reaction not exactly recalled, but it DID cause an issue    FAMILY HISTORY: Family History  Problem Relation Age of Onset   Hypertension Sister       Objective:  *** General: No acute distress.  Patient appears well-groomed.   Head:  Normocephalic/atraumatic Eyes:  Fundi examined but not visualized Neck: supple, no paraspinal tenderness, full range of motion Heart:  Regular rate and rhythm Neurological Exam: alert and oriented to person, place, and time.  Speech fluent and not dysarthric, language intact.  CN II-XII intact. Bulk and tone normal, muscle strength 4+/5 bilateral upper extremities except 3+ grip bilaterally, 2/5 bilateral hip flexion, 3+/5 right knee extension/flexion, right foot drop, otherwise /5 throughout.  Deep tendon reflexes 3+ throughout  Finger to nose testing intact.  Nonambulatory ***  Shon Millet, DO  CC: Lorenda Ishihara, MD

## 2023-01-19 ENCOUNTER — Ambulatory Visit: Payer: Medicare Other | Admitting: Neurology

## 2023-02-08 DIAGNOSIS — I480 Paroxysmal atrial fibrillation: Secondary | ICD-10-CM | POA: Diagnosis not present

## 2023-02-08 DIAGNOSIS — Z0000 Encounter for general adult medical examination without abnormal findings: Secondary | ICD-10-CM | POA: Diagnosis not present

## 2023-02-08 DIAGNOSIS — E1169 Type 2 diabetes mellitus with other specified complication: Secondary | ICD-10-CM | POA: Diagnosis not present

## 2023-02-08 DIAGNOSIS — Z1331 Encounter for screening for depression: Secondary | ICD-10-CM | POA: Diagnosis not present

## 2023-02-08 DIAGNOSIS — R54 Age-related physical debility: Secondary | ICD-10-CM | POA: Diagnosis not present

## 2023-02-08 DIAGNOSIS — E440 Moderate protein-calorie malnutrition: Secondary | ICD-10-CM | POA: Diagnosis not present

## 2023-02-08 DIAGNOSIS — Z9181 History of falling: Secondary | ICD-10-CM | POA: Diagnosis not present

## 2023-02-08 DIAGNOSIS — I25118 Atherosclerotic heart disease of native coronary artery with other forms of angina pectoris: Secondary | ICD-10-CM | POA: Diagnosis not present

## 2023-02-08 DIAGNOSIS — E1142 Type 2 diabetes mellitus with diabetic polyneuropathy: Secondary | ICD-10-CM | POA: Diagnosis not present

## 2023-02-08 DIAGNOSIS — E785 Hyperlipidemia, unspecified: Secondary | ICD-10-CM | POA: Diagnosis not present

## 2023-02-08 DIAGNOSIS — I10 Essential (primary) hypertension: Secondary | ICD-10-CM | POA: Diagnosis not present

## 2023-02-08 DIAGNOSIS — G35 Multiple sclerosis: Secondary | ICD-10-CM | POA: Diagnosis not present

## 2023-02-08 DIAGNOSIS — H401134 Primary open-angle glaucoma, bilateral, indeterminate stage: Secondary | ICD-10-CM | POA: Diagnosis not present

## 2023-02-08 DIAGNOSIS — Z23 Encounter for immunization: Secondary | ICD-10-CM | POA: Diagnosis not present

## 2023-02-14 ENCOUNTER — Telehealth: Payer: Self-pay | Admitting: Neurology

## 2023-02-14 NOTE — Telephone Encounter (Signed)
Pt is sch for 02-16-23

## 2023-02-14 NOTE — Telephone Encounter (Signed)
Patient needs to speak to someone about a electric wheelchair. She has medicare. She is new to the area.

## 2023-02-15 NOTE — Progress Notes (Unsigned)
NEUROLOGY FOLLOW UP OFFICE NOTE  Joanne Hayden [id number]  Assessment/Plan:   Multiple sclerosis  Her motorized wheelchair is no longer functioning.  She has both upper extremity and lower extremity weakness.  It is difficult for her to push herself.  Therefore, I think she needs a motorized wheelchair.  Will look into getting her a new one.    Otherwise, follow up one year  Total time spent in chart and face to face with patient:  22 minutes   Subjective:  Joanne Hayden is a 82 year old right-handed female with CHF, CAD s/p MI, diabetes mellitus, HTN and HLD who follows up for multiple sclerosis.  She is accompanied by her sister.   UPDATE: Current DMT:  None Current medications:  Eliquis, lisinopril, metoprolol, atorvastatin 40mg     Vision:  No problems  Uses reading glasses. Motor:  Generalized weakness.   Sensory/Pain:  Generalized back pain.  Left ankle aches.   Gait:  Uses manual wheelchair outside and in the house.  Her motorized wheelchair no longer works but she needs one because she cannot push herself.   Bowel/Bladder:  No issues Fatigue:  Chronic Cognition:  Memory okay Mood:  ok  HISTORY: She was diagnosed with multiple sclerosis in 1985.  Around 1980 she had numbness in right toe that traveled up to her right knee lasting 2-3 weeks. She had a recurrent episode about a year later but the numbness traveled up her thigh.  It occurred a third time and she was referred to neurology when she had an MRI of the brain that revealed an MS plaque on the right cerebral hemisphere.  She also had abnormal CSF analysis.  She was started on Avonex for 30 years until 2021.  She stopped because she had an MI in 2020.  While on Avonex, she continued to have episodes of numbness and tingling.  Gradually she developed increased weakness in the legs and difficulty ambulating.      Past DMT:  Avonex (took for 30 years, stopped April 2021)   She reports episodes of losing  consciousness.  No preceding headache, lightheadedness/dizziness, diaphoresis, chest pain, shortness of breath, or palpitations.   No warning.  Never witnessed.  She is unconscious for brief period.  The first time it occurred, it was after an Avonex injection.  She started having full body shaking and lowered herself to the floor.  She doesn't remember if she had passed out but she realized she had urinated but no tongue biting.  She had two subsequent episodes not preceded by shaking.  The last episode occurred after her Moderna COVID shot in April 2021.  When she woke up on the floor, she did not have incontinence or tongue biting.  EEG on 01/24/2020 was normal.  PAST MEDICAL HISTORY: Past Medical History:  Diagnosis Date   CHF (congestive heart failure) (HCC)    Diabetes mellitus without complication (HCC)    Heart attack (HCC)    Hyperlipidemia    Hypertension    Multiple sclerosis (HCC)     MEDICATIONS: Current Outpatient Medications on File Prior to Visit  Medication Sig Dispense Refill   acetaminophen (TYLENOL) 325 MG tablet Take 325 mg by mouth every 6 (six) hours as needed for mild pain or headache.     atorvastatin (LIPITOR) 40 MG tablet Take 40 mg by mouth at bedtime.     Continuous Blood Gluc Receiver (FREESTYLE LIBRE 14 DAY READER) DEVI See admin instructions.     Continuous Blood  Gluc Sensor (FREESTYLE LIBRE 2 SENSOR) MISC Inject 1 Device into the skin every 14 (fourteen) days.     dorzolamide-timolol (COSOPT) 22.3-6.8 MG/ML ophthalmic solution Place 1 drop into both eyes in the morning and at bedtime.     ELIQUIS 2.5 MG TABS tablet Take 2.5 mg by mouth 2 (two) times daily.     latanoprost (XALATAN) 0.005 % ophthalmic solution Place 1 drop into both eyes at bedtime.     lisinopril (ZESTRIL) 10 MG tablet 1 tablet     metoprolol tartrate (LOPRESSOR) 25 MG tablet Take 1 tablet (25 mg total) by mouth 2 (two) times daily. 60 tablet 2   Netarsudil Dimesylate (RHOPRESSA) 0.02 % SOLN       nitroGLYCERIN (NITROSTAT) 0.4 MG SL tablet Place 1 tablet (0.4 mg total) under the tongue every 5 (five) minutes as needed for chest pain. 90 tablet 3   No current facility-administered medications on file prior to visit.    ALLERGIES: Allergies  Allergen Reactions   Codeine Nausea Only and Other (See Comments)    Severe nausea   Gabapentin Other (See Comments)    Drowsiness   Naloxone Other (See Comments)    Patient disputes this in 2022   Nitrofurantoin Nausea Only and Other (See Comments)    Severe nausea and made the patient "feel odd"   Norco [Hydrocodone-Acetaminophen] Other (See Comments)    Made the patient "feel odd"   Sulfa Antibiotics Other (See Comments)    Reaction not exactly recalled, but it DID cause an issue    FAMILY HISTORY: Family History  Problem Relation Age of Onset   Hypertension Sister       Objective:  Blood pressure (!) 103/57, pulse 64, height 5\' 5"  (1.651 m), weight 110 lb (49.9 kg). General: No acute distress.  Patient appears well-groomed.   Head:  Normocephalic/atraumatic Eyes:  Fundi examined but not visualized Neck: supple, no paraspinal tenderness, full range of motion Heart:  Regular rate and rhythm Neurological Exam: alert and oriented to person, place, and time.  Speech fluent and not dysarthric, language intact.  CN II-XII intact. Bulk and tone normal, muscle strength 4+/5 bilateral upper extremities except 3+ grip bilaterally, 2/5 bilateral hip flexion, 3+/5 right knee extension/flexion, right foot drop, otherwise /5 throughout.  Deep tendon reflexes 3+ throughout  Finger to nose testing intact.  Nonambulatory.  Shon Millet, DO  CC: Lorenda Ishihara, MD

## 2023-02-16 ENCOUNTER — Ambulatory Visit: Payer: Medicare PPO | Admitting: Neurology

## 2023-02-16 ENCOUNTER — Encounter: Payer: Self-pay | Admitting: Neurology

## 2023-02-16 VITALS — BP 103/57 | HR 64 | Ht 65.0 in | Wt 110.0 lb

## 2023-02-16 DIAGNOSIS — Z0000 Encounter for general adult medical examination without abnormal findings: Secondary | ICD-10-CM | POA: Diagnosis not present

## 2023-02-16 DIAGNOSIS — G35 Multiple sclerosis: Secondary | ICD-10-CM

## 2023-02-16 DIAGNOSIS — E1169 Type 2 diabetes mellitus with other specified complication: Secondary | ICD-10-CM | POA: Diagnosis not present

## 2023-02-16 DIAGNOSIS — E440 Moderate protein-calorie malnutrition: Secondary | ICD-10-CM | POA: Diagnosis not present

## 2023-02-16 DIAGNOSIS — I10 Essential (primary) hypertension: Secondary | ICD-10-CM | POA: Diagnosis not present

## 2023-02-16 NOTE — Patient Instructions (Signed)
Will see what we need to do about getting another motorized wheelchair.

## 2023-02-16 NOTE — Addendum Note (Signed)
Addended byEverlena Cooper, Dietrick Barris R on: 02/16/2023 11:42 AM   Modules accepted: Level of Service

## 2023-03-07 ENCOUNTER — Telehealth: Payer: Self-pay | Admitting: Neurology

## 2023-03-07 DIAGNOSIS — R269 Unspecified abnormalities of gait and mobility: Secondary | ICD-10-CM

## 2023-03-07 DIAGNOSIS — G35 Multiple sclerosis: Secondary | ICD-10-CM

## 2023-03-07 DIAGNOSIS — G35D Multiple sclerosis, unspecified: Secondary | ICD-10-CM

## 2023-03-07 NOTE — Telephone Encounter (Signed)
Patient advised waiting on the company to get back to [place]. Bed side commode order ready need a supply company to.

## 2023-03-07 NOTE — Telephone Encounter (Signed)
Pt called in and left a message with the access nurse on 03/06/23. She would like to check on the status of an order for a portable toilet and wheelchair.

## 2023-03-09 NOTE — Telephone Encounter (Signed)
PATIENT is returning a call to [employer]

## 2023-03-09 NOTE — Telephone Encounter (Signed)
LMOVM for patient.  

## 2023-03-09 NOTE — Telephone Encounter (Signed)
Working on the patient wheelchair order now.  LMOVM advising of the progress.

## 2023-03-15 NOTE — Telephone Encounter (Signed)
Bedside commode order sent to [employer] [facility].

## 2023-03-19 ENCOUNTER — Other Ambulatory Visit: Payer: Self-pay | Admitting: Cardiology

## 2023-05-03 ENCOUNTER — Telehealth: Payer: Self-pay | Admitting: Neurology

## 2023-05-03 NOTE — Telephone Encounter (Signed)
Pt called in wanting to follow up about getting a power wheelchair.

## 2023-05-04 NOTE — Telephone Encounter (Signed)
Pt called in stating she needs a power wheelchair and would like to follow up on what needs to be done to get one

## 2023-05-04 NOTE — Telephone Encounter (Signed)
Script sent 03/23/23 company was to reach out to patient.

## 2023-05-04 NOTE — Telephone Encounter (Signed)
  Joanne Hayden  Customer Service Specialist [address] [place], [place] [address]  Phone: [phone]  Fax:      [phone]     Per email, Good afternoon. We have tried to contact the client since 03/24/2023 & have not reached her. We have also sent a client contact letter hoping that we would receive a response back. If you can reach her, please have her to call our office to proceed. Thanks.    Information given to patient.

## 2023-05-13 ENCOUNTER — Telehealth: Payer: Self-pay | Admitting: Neurology

## 2023-05-13 NOTE — Telephone Encounter (Signed)
 Pt. Needs rqst an resubmitted order for bed side commode, give Pt a call when resubmitted

## 2023-05-13 NOTE — Telephone Encounter (Signed)
 LMOVM for patient, Should the new order go back to [employer] or another Medical supply company.

## 2023-05-18 NOTE — Telephone Encounter (Signed)
Per patient she has [employer] again. The member from 2022 but take sufix off 00. Card scanned in chart.   Resend the bedside commode script to [employer].

## 2023-05-30 DIAGNOSIS — G35 Multiple sclerosis: Secondary | ICD-10-CM | POA: Diagnosis not present

## 2023-06-16 ENCOUNTER — Telehealth: Payer: Self-pay

## 2023-06-16 DIAGNOSIS — G35 Multiple sclerosis: Secondary | ICD-10-CM

## 2023-06-16 NOTE — Telephone Encounter (Signed)
 Bedside Commode order added.

## 2023-06-17 ENCOUNTER — Telehealth: Payer: Self-pay | Admitting: Neurology

## 2023-06-17 ENCOUNTER — Encounter (HOSPITAL_COMMUNITY): Payer: Self-pay

## 2023-06-17 ENCOUNTER — Other Ambulatory Visit: Payer: Self-pay

## 2023-06-17 ENCOUNTER — Emergency Department (HOSPITAL_COMMUNITY)
Admission: EM | Admit: 2023-06-17 | Discharge: 2023-06-18 | Disposition: A | Discharge status: Home / Self Care | Payer: Self-pay | Attending: Emergency Medicine | Admitting: Emergency Medicine

## 2023-06-17 ENCOUNTER — Emergency Department (HOSPITAL_COMMUNITY): Payer: Self-pay

## 2023-06-17 DIAGNOSIS — Y92010 Kitchen of single-family (private) house as the place of occurrence of the external cause: Secondary | ICD-10-CM | POA: Insufficient documentation

## 2023-06-17 DIAGNOSIS — W19XXXA Unspecified fall, initial encounter: Secondary | ICD-10-CM | POA: Diagnosis not present

## 2023-06-17 DIAGNOSIS — W010XXA Fall on same level from slipping, tripping and stumbling without subsequent striking against object, initial encounter: Secondary | ICD-10-CM | POA: Insufficient documentation

## 2023-06-17 DIAGNOSIS — I10 Essential (primary) hypertension: Secondary | ICD-10-CM | POA: Diagnosis not present

## 2023-06-17 DIAGNOSIS — Z7901 Long term (current) use of anticoagulants: Secondary | ICD-10-CM | POA: Insufficient documentation

## 2023-06-17 DIAGNOSIS — I959 Hypotension, unspecified: Secondary | ICD-10-CM | POA: Diagnosis not present

## 2023-06-17 DIAGNOSIS — G4489 Other headache syndrome: Secondary | ICD-10-CM | POA: Diagnosis not present

## 2023-06-17 DIAGNOSIS — R519 Headache, unspecified: Secondary | ICD-10-CM | POA: Insufficient documentation

## 2023-06-17 DIAGNOSIS — R42 Dizziness and giddiness: Secondary | ICD-10-CM | POA: Diagnosis not present

## 2023-06-17 LAB — COMPREHENSIVE METABOLIC PANEL
ALT: 36 U/L (ref 0–44)
AST: 52 U/L — ABNORMAL HIGH (ref 15–41)
Albumin: 3.6 g/dL (ref 3.5–5.0)
Alkaline Phosphatase: 92 U/L (ref 38–126)
Anion gap: 6 (ref 5–15)
BUN: 19 mg/dL (ref 8–23)
CO2: 28 mmol/L (ref 22–32)
Calcium: 9.4 mg/dL (ref 8.9–10.3)
Chloride: 105 mmol/L (ref 98–111)
Creatinine, Ser: 0.68 mg/dL (ref 0.44–1.00)
GFR, Estimated: >60 mL/min (ref >60)
Glucose, Bld: 120 mg/dL — ABNORMAL HIGH (ref 70–99)
Potassium: 4.2 mmol/L (ref 3.5–5.1)
Sodium: 139 mmol/L (ref 135–145)
Total Bilirubin: 0.5 mg/dL (ref 0.0–1.2)
Total Protein: 7.3 g/dL (ref 6.5–8.1)

## 2023-06-17 LAB — CBC
HCT: 35.7 % — ABNORMAL LOW (ref 36.0–46.0)
Hemoglobin: 11.1 g/dL — ABNORMAL LOW (ref 12.0–15.0)
MCH: 29.4 pg (ref 26.0–34.0)
MCHC: 31.1 g/dL (ref 30.0–36.0)
MCV: 94.4 fL (ref 80.0–100.0)
Platelets: 198 10*3/uL (ref 150–400)
RBC: 3.78 MIL/uL — ABNORMAL LOW (ref 3.87–5.11)
RDW: 14.1 % (ref 11.5–15.5)
WBC: 5.2 10*3/uL (ref 4.0–10.5)
nRBC: 0 % (ref 0.0–0.2)

## 2023-06-17 LAB — SAMPLE TO BLOOD BANK

## 2023-06-17 LAB — I-STAT CG4 LACTIC ACID, ED: Lactic Acid, Venous: 0.6 mmol/L (ref 0.5–1.9)

## 2023-06-17 LAB — PROTIME-INR
INR: 1.6 — ABNORMAL HIGH (ref 0.8–1.2)
Prothrombin Time: 19.2 s — ABNORMAL HIGH (ref 11.4–15.2)

## 2023-06-17 LAB — ETHANOL: Alcohol, Ethyl (B): <10 mg/dL (ref <10)

## 2023-06-17 LAB — I-STAT CHEM 8, ED
BUN: 21 mg/dL (ref 8–23)
Calcium, Ion: 1.21 mmol/L (ref 1.15–1.40)
Chloride: 105 mmol/L (ref 98–111)
Creatinine, Ser: 0.6 mg/dL (ref 0.44–1.00)
Glucose, Bld: 115 mg/dL — ABNORMAL HIGH (ref 70–99)
HCT: 36 % (ref 36.0–46.0)
Hemoglobin: 12.2 g/dL (ref 12.0–15.0)
Potassium: 4.2 mmol/L (ref 3.5–5.1)
Sodium: 141 mmol/L (ref 135–145)
TCO2: 27 mmol/L (ref 22–32)

## 2023-06-17 LAB — CK TOTAL AND CKMB (NOT AT ARMC)
CK, MB: 5.5 ng/mL — ABNORMAL HIGH (ref 0.5–5.0)
Total CK: 144 U/L (ref 38–234)

## 2023-06-17 MED ORDER — ONDANSETRON HCL 4 MG/2ML IJ SOLN
4.0000 mg | Freq: Once | INTRAMUSCULAR | Status: DC
  Filled 2023-06-17: qty 2

## 2023-06-17 MED ORDER — IOHEXOL 350 MG/ML SOLN
50.0000 mL | Freq: Once | INTRAVENOUS | Status: AC | PRN
  Administered 2023-06-17: 50 mL via INTRAVENOUS

## 2023-06-17 NOTE — Progress Notes (Signed)
 Trauma Response Nurse Documentation   Joanne Hayden is a 83 y.o. female arriving to [facility] ED via EMS  On Eliquis (apixaban) daily. Trauma was activated as a Level 2 by ED charge RN based on the following trauma criteria Elderly patients > 65 with head trauma on anti-coagulation (excluding ASA). Trauma team at the bedside on patient arrival.   Patient cleared for CT by Dr. Freida Busman EDP. Pt transported to CT with trauma response nurse present to monitor. RN remained with the patient throughout their absence from the department for clinical observation.   GCS 15.  History   Past Medical History:  Diagnosis Date   CHF (congestive heart failure) (HCC)    Diabetes mellitus without complication (HCC)    Heart attack (HCC)    Hyperlipidemia    Hypertension    Multiple sclerosis (HCC)      Past Surgical History:  Procedure Laterality Date   HIP SURGERY     HYSTEROTOMY     LEFT HEART CATH AND CORONARY ANGIOGRAPHY N/A 02/24/2021   Procedure: LEFT HEART CATH AND CORONARY ANGIOGRAPHY;  Surgeon: Elder Negus, MD;  Location: [facility] INVASIVE CV LAB;  Service: Cardiovascular;  Laterality: N/A;       Initial Focused Assessment (If applicable, or please see trauma documentation): Alert/oriented female presents via EMS from home, states she fell backwards while standing at her fridge. States she lost her equilibrium. No LOC. Reports chronic left hip pain.   Airway patent, BS clear No obvious uncontrolled hemorrhage GCS 15 PERRLA 3  CT's Completed:   CT Head C/T/L spine, CAP,  Interventions:  IV start and trauma lab draw Portable chest and pelvis XRAY CT head, C/T/L spine, CAP  Plan for disposition:  D/C  Consults completed:    Event Summary: Presents via EMS from home after a fall in her kitchen. States she struck the back of her head. No LOC. No obvious deformities/wounds/bleeding. Trauma scans unremarkable.  MTP Summary (If applicable): NA  Bedside handoff with ED RN  Marcie Bal.    Coreena Rubalcava O Advik Weatherspoon  Trauma Response RN  Please call TRN at [phone] for further assistance.

## 2023-06-17 NOTE — ED Notes (Signed)
 Please give an update to Aram Beecham (niece) [phone]

## 2023-06-17 NOTE — ED Triage Notes (Signed)
 Patient coming from home from a fall. Patient was fixing dinner went to turn and fell. Patient fall on her back and hit the back of her head. 1 out of 10 headache. Patient taking Eliquis. Pupils equal and reactive.  EMS VS 130/70 BP 64 HR 16 RR 98% RA 104 CBG

## 2023-06-17 NOTE — ED Notes (Signed)
 Patient gave verbal consent to update niece.

## 2023-06-17 NOTE — ED Provider Notes (Signed)
  EMERGENCY DEPARTMENT AT [facility] Provider Note   CSN: [id number] Arrival date & time: 06/17/23  1918     History  Chief Complaint  Patient presents with   Joanne Hayden is a 83 y.o. female who presents as a level 2 trauma after a fall in her kitchen.  Patient reports that she was getting something out of her fridge when she stepped back and lost her balance, striking the back of her head on the ground.  Patient is on blood thinners, Eliquis.  She took her morning dose, but has not yet taken her evening dose.  She denies LOC.  She had immediate pain in her head, however denies bleeding.  She was subsequently able to call 911 and ambulate.   Fall       Home Medications Prior to Admission medications   Medication Sig Start Date End Date Taking? Authorizing Provider  acetaminophen (TYLENOL) 325 MG tablet Take 325 mg by mouth every 6 (six) hours as needed for mild pain or headache.    [provider]  atorvastatin (LIPITOR) 40 MG tablet Take 40 mg by mouth at bedtime. 11/14/19   [provider]  Continuous Blood Gluc Receiver (FREESTYLE LIBRE 14 DAY READER) DEVI See admin instructions. Patient not taking: Reported on 02/16/2023 09/29/20   [provider]  Continuous Blood Gluc Sensor (FREESTYLE LIBRE 2 SENSOR) MISC Inject 1 Device into the skin every 14 (fourteen) days. Patient not taking: Reported on 02/16/2023    [provider]  dorzolamide-timolol (COSOPT) 22.3-6.8 MG/ML ophthalmic solution Place 1 drop into both eyes in the morning and at bedtime. 11/14/19   [provider]  ELIQUIS 2.5 MG TABS tablet Take 2.5 mg by mouth 2 (two) times daily. 11/14/19   [provider]  latanoprost (XALATAN) 0.005 % ophthalmic solution Place 1 drop into both eyes at bedtime. 11/14/19   [provider]  lisinopril (ZESTRIL) 10 MG tablet 1 tablet    [provider]  metoprolol tartrate (LOPRESSOR)  25 MG tablet Take 1 tablet (25 mg total) by mouth 2 (two) times daily. 02/24/21   Patwardhan, Anabel Bene, MD  Netarsudil Dimesylate (RHOPRESSA) 0.02 % SOLN     [provider]  nitroGLYCERIN (NITROSTAT) 0.4 MG SL tablet DISSOLVE 1 TABLET UNDER THE TONGUE EVERY 5 MINUTES AS NEEDED FOR CHEST PAIN 03/21/23   Patwardhan, Anabel Bene, MD      Allergies    Codeine, Gabapentin, Naloxone, Nitrofurantoin, Norco [hydrocodone-acetaminophen], and Sulfa antibiotics    Review of Systems   Review of Systems  Physical Exam Updated Vital Signs BP 112/62   Pulse 74   Temp (!) 97.4 F (36.3 C) (Oral)   Resp 20   SpO2 100%  Physical Exam Vitals and nursing note reviewed.  Constitutional:      General: She is not in acute distress.    Appearance: Normal appearance. She is not ill-appearing.  HENT:     Head: Normocephalic and atraumatic.     Mouth/Throat:     Lips: Pink. No lesions.     Mouth: Mucous membranes are moist.     Pharynx: Oropharynx is clear. Uvula midline.  Eyes:     Extraocular Movements: Extraocular movements intact.     Conjunctiva/sclera: Conjunctivae normal.     Pupils: Pupils are equal, round, and reactive to light.  Cardiovascular:     Rate and Rhythm: Normal rate and regular rhythm.     Pulses:  Radial pulses are 2+ on the right side and 2+ on the left side.       Dorsalis pedis pulses are 2+ on the right side and 2+ on the left side.     Heart sounds: Murmur heard.  Pulmonary:     Effort: Pulmonary effort is normal.     Breath sounds: Normal breath sounds.  Abdominal:     Palpations: Abdomen is soft.     Tenderness: There is no abdominal tenderness.  Musculoskeletal:     Cervical back: Full passive range of motion without pain. No pain with movement, spinous process tenderness or muscular tenderness.     Right lower leg: No edema.     Left lower leg: No edema.     Comments: No noted deformities or tenderness to palpation over extremities.  Chest stable to  anterior and lateral compression.  Pelvis stable to lateral compression.  Skin:    General: Skin is warm and dry.     Capillary Refill: Capillary refill takes less than 2 seconds.     Findings: No abrasion or bruising.  Neurological:     Mental Status: She is alert.     Comments: 5 out of 5 strength in bilateral upper and lower extremities  Psychiatric:        Behavior: Behavior is cooperative.     ED Results / Procedures / Treatments   Labs (all labs ordered are listed, but only abnormal results are displayed) Labs Reviewed  COMPREHENSIVE METABOLIC PANEL - Abnormal; Notable for the following components:      Result Value   Glucose, Bld 120 (*)    AST 52 (*)    All other components within normal limits  CBC - Abnormal; Notable for the following components:   RBC 3.78 (*)    Hemoglobin 11.1 (*)    HCT 35.7 (*)    All other components within normal limits  URINALYSIS, ROUTINE W REFLEX MICROSCOPIC - Abnormal; Notable for the following components:   APPearance HAZY (*)    Specific Gravity, Urine >1.046 (*)    Protein, ur 30 (*)    Leukocytes,Ua SMALL (*)    Bacteria, UA RARE (*)    All other components within normal limits  PROTIME-INR - Abnormal; Notable for the following components:   Prothrombin Time 19.2 (*)    INR 1.6 (*)    All other components within normal limits  CK TOTAL AND CKMB (NOT AT [facility]) - Abnormal; Notable for the following components:   CK, MB 5.5 (*)    All other components within normal limits  I-STAT CHEM 8, ED - Abnormal; Notable for the following components:   Glucose, Bld 115 (*)    All other components within normal limits  URINE CULTURE  ETHANOL  I-STAT CG4 LACTIC ACID, ED  SAMPLE TO BLOOD BANK    EKG EKG Interpretation Date/Time:  Friday June 17 2023 19:33:54 EDT Ventricular Rate:  66 PR Interval:  182 QRS Duration:  108 QT Interval:  403 QTC Calculation: 423 R Axis:   2  Text Interpretation: Sinus rhythm Ventricular premature  complex Low voltage, extremity and precordial leads Abnormal R-wave progression, early transition Confirmed by Lorre Nick ([id number]) on 06/17/2023 8:02:32 PM  Radiology CT CHEST ABDOMEN PELVIS W CONTRAST Result Date: 06/17/2023 CLINICAL DATA:  Polytrauma, blunt.  Fall EXAM: CT CHEST, ABDOMEN, AND PELVIS WITH CONTRAST TECHNIQUE: Multidetector CT imaging of the chest, abdomen and pelvis was performed following the standard protocol during bolus administration of intravenous  contrast. RADIATION DOSE REDUCTION: This exam was performed according to the departmental dose-optimization program which includes automated exposure control, adjustment of the mA and/or kV according to patient size and/or use of iterative reconstruction technique. CONTRAST:  50mL OMNIPAQUE IOHEXOL 350 MG/ML SOLN COMPARISON:  None Available. FINDINGS: CHEST: Cardiovascular: No aortic injury. The thoracic aorta is normal in caliber. The heart is enlarged in size with enlarged right atrium. No significant pericardial effusion. Mediastinum/Nodes: No pneumomediastinum. No mediastinal hematoma. The esophagus is unremarkable. The thyroid is unremarkable. The central airways are patent. No mediastinal, hilar, or axillary lymphadenopathy. Lungs/Pleura: No focal consolidation. No pulmonary nodule. No pulmonary mass. No pulmonary contusion or laceration. No pneumatocele formation. No pleural effusion. No pneumothorax. No hemothorax. Musculoskeletal/Chest wall: No chest wall mass. No acute rib or sternal fracture. Please see separately dictated CT thoracolumbar spine. ABDOMEN / PELVIS: Hepatobiliary: Not enlarged. Diffusely hypodense hepatic parenchyma compared to the spleen. Heterogeneous hepatic parenchyma with query multiple enhancing pericentimeter lesion. No laceration or subcapsular hematoma. Calcified stone within the gallbladder lumen. No biliary ductal dilatation. Pancreas: Normal pancreatic contour. No main pancreatic duct dilatation. Spleen:  Not enlarged. No focal lesion. No laceration, subcapsular hematoma, or vascular injury. Adrenals/Urinary Tract: No nodularity bilaterally. Bilateral kidneys enhance symmetrically. No hydronephrosis. No contusion, laceration, or subcapsular hematoma. No injury to the vascular structures or collecting systems. No hydroureter. The urinary bladder is unremarkable. Stomach/Bowel: No small or large bowel wall thickening or dilatation. Extensive colonic diverticulosis. The appendix is unremarkable. Vasculature/Lymphatics: Moderate atherosclerotic plaque. No abdominal aorta or iliac aneurysm. No active contrast extravasation or pseudoaneurysm. No abdominal, pelvic, inguinal lymphadenopathy. Reproductive: Normal. Other: No simple free fluid ascites. No pneumoperitoneum. No hemoperitoneum. No mesenteric hematoma identified. No organized fluid collection. Musculoskeletal: No significant soft tissue hematoma. Diffusely decreased bone density. No acute pelvic fracture. Please see separately dictated CT thoracolumbar spine. Three screw fixation of the right femoral neck. Other ports and devices: None. IMPRESSION: 1. No acute intrathoracic, intra-abdominal, intrapelvic traumatic injury. 2. Please see separately dictated CT thoracolumbar spine. Other imaging findings of potential clinical significance: 1. Enlarged right atria. 2. Hepatic steatosis. 3. Heterogeneous hepatic parenchyma with query multiple enhancing pericentimeter lesion. Correlation with prior cross-sectional imaging would be of value. If not available, re when the patient is clinically stable and able to follow directions and hold their breath (preferably as an outpatient) further evaluation with dedicated MRI liver protocol should be considered. 4. Cholelithiasis with no CT evidence of acute cholecystitis. 5. Extensive colonic diverticulosis with no acute diverticulitis. 6.  Aortic Atherosclerosis (ICD10-I70.0). Electronically Signed   By: Tish Frederickson M.D.    On: 06/17/2023 21:16   CT T-SPINE NO CHARGE Result Date: 06/17/2023 CLINICAL DATA:  Back trauma, no prior imaging (Age >= 16y) fall. Patient was fixing dinner went to turn and fell. Patient fall on her back and hit the back of her head. 1 out of 10 headache. Patient taking Eliquis. Pupils equal and reactive. EXAM: CT THORACIC AND LUMBAR SPINE WITHOUT CONTRAST TECHNIQUE: Multidetector CT imaging of the thoracic and lumbar spine was performed without contrast. Multiplanar CT image reconstructions were also generated. RADIATION DOSE REDUCTION: This exam was performed according to the departmental dose-optimization program which includes automated exposure control, adjustment of the mA and/or kV according to patient size and/or use of iterative reconstruction technique. COMPARISON:  CT left hip 06/19/2022 FINDINGS: CT THORACIC SPINE FINDINGS Alignment: Normal. Vertebrae: Diffusely decreased bone density. Multilevel moderate degenerative changes spine. No associated severe osseous neural foraminal or central canal stenosis. No  acute fracture or focal pathologic process. Paraspinal and other soft tissues: Negative. Disc levels: Maintained. CT LUMBAR SPINE FINDINGS Segmentation: 5 lumbar type vertebrae. Alignment: Normal. Vertebrae: Diffusely decreased bone density. No acute fracture. Interval development of cortical destruction along the inferior endplate L5 and superior endplate L1. Paraspinal and other soft tissues: Negative. Disc levels: Interval debulk disc space vacuum phenomenon. IMPRESSION: CT THORACIC SPINE IMPRESSION 1. No acute displaced fracture or traumatic listhesis of the thoracic spine. CT LUMBAR SPINE IMPRESSION 1. No acute displaced fracture or traumatic listhesis of the lumbar spine. 2. Interval development of nonspecific cortical destruction along the inferior endplate L5 and superior endplate L1. Electronically Signed   By: Tish Frederickson M.D.   On: 06/17/2023 21:07   CT L-SPINE NO CHARGE Result  Date: 06/17/2023 CLINICAL DATA:  Back trauma, no prior imaging (Age >= 16y) fall. Patient was fixing dinner went to turn and fell. Patient fall on her back and hit the back of her head. 1 out of 10 headache. Patient taking Eliquis. Pupils equal and reactive. EXAM: CT THORACIC AND LUMBAR SPINE WITHOUT CONTRAST TECHNIQUE: Multidetector CT imaging of the thoracic and lumbar spine was performed without contrast. Multiplanar CT image reconstructions were also generated. RADIATION DOSE REDUCTION: This exam was performed according to the departmental dose-optimization program which includes automated exposure control, adjustment of the mA and/or kV according to patient size and/or use of iterative reconstruction technique. COMPARISON:  CT left hip 06/19/2022 FINDINGS: CT THORACIC SPINE FINDINGS Alignment: Normal. Vertebrae: Diffusely decreased bone density. Multilevel moderate degenerative changes spine. No associated severe osseous neural foraminal or central canal stenosis. No acute fracture or focal pathologic process. Paraspinal and other soft tissues: Negative. Disc levels: Maintained. CT LUMBAR SPINE FINDINGS Segmentation: 5 lumbar type vertebrae. Alignment: Normal. Vertebrae: Diffusely decreased bone density. No acute fracture. Interval development of cortical destruction along the inferior endplate L5 and superior endplate L1. Paraspinal and other soft tissues: Negative. Disc levels: Interval debulk disc space vacuum phenomenon. IMPRESSION: CT THORACIC SPINE IMPRESSION 1. No acute displaced fracture or traumatic listhesis of the thoracic spine. CT LUMBAR SPINE IMPRESSION 1. No acute displaced fracture or traumatic listhesis of the lumbar spine. 2. Interval development of nonspecific cortical destruction along the inferior endplate L5 and superior endplate L1. Electronically Signed   By: Tish Frederickson M.D.   On: 06/17/2023 21:07   DG Pelvis Portable Result Date: 06/17/2023 CLINICAL DATA:  Trauma EXAM: PORTABLE  PELVIS 1-2 VIEWS COMPARISON:  CT abdomen pelvis 06/17/2023. FINDINGS: Diffusely decreased bone density. There is no evidence of pelvic fracture or diastasis. No acute displaced fracture or dislocation of either hips. Right femoral neck screw fixation noted. No radiographic findings suggest surgical hardware complication. No pelvic bone lesions are seen. IMPRESSION: Negative for acute traumatic injury. Electronically Signed   By: Tish Frederickson M.D.   On: 06/17/2023 20:55   DG Chest Port 1 View Result Date: 06/17/2023 CLINICAL DATA:  Trauma.  Fall EXAM: PORTABLE CHEST 1 VIEW COMPARISON:  CT chest 06/17/2023. FINDINGS: Enlarged cardiac silhouette. The heart and mediastinal contours are unchanged. Coronary artery stent. Hyperinflation of the lungs. No focal consolidation. No pulmonary edema. No pleural effusion. No pneumothorax. No acute osseous abnormality. IMPRESSION: No active disease. Electronically Signed   By: Tish Frederickson M.D.   On: 06/17/2023 20:48   CT HEAD WO CONTRAST Result Date: 06/17/2023 CLINICAL DATA:  Head trauma, moderate-severe; Polytrauma, blunt EXAM: CT HEAD WITHOUT CONTRAST CT CERVICAL SPINE WITHOUT CONTRAST TECHNIQUE: Multidetector CT imaging of the head and  cervical spine was performed following the standard protocol without intravenous contrast. Multiplanar CT image reconstructions of the cervical spine were also generated. RADIATION DOSE REDUCTION: This exam was performed according to the departmental dose-optimization program which includes automated exposure control, adjustment of the mA and/or kV according to patient size and/or use of iterative reconstruction technique. COMPARISON:  None Available. FINDINGS: CT HEAD FINDINGS Brain: No evidence of large-territorial acute infarction. No parenchymal hemorrhage. No mass lesion. No extra-axial collection. No mass effect or midline shift. No hydrocephalus. Basilar cisterns are patent. Vascular: No hyperdense vessel. Atherosclerotic  calcifications are present within the cavernous internal carotid and vertebral arteries. Skull: No acute fracture or focal lesion. Sinuses/Orbits: Paranasal sinuses and mastoid air cells are clear. Bilateral lens replacement. Otherwise the orbits are unremarkable. Other: None. CT CERVICAL SPINE FINDINGS Alignment: Normal. Skull base and vertebrae: Multilevel disc bulge. Ligamentum flavum calcification. Multilevel mild-to-moderate degenerative changes of the spine. No associated severe osseous neural foraminal or central canal stenosis. No acute fracture. No aggressive appearing focal osseous lesion or focal pathologic process. Soft tissues and spinal canal: No prevertebral fluid or swelling. No visible canal hematoma. Upper chest: Unremarkable. Other: Slightly heterogeneous thyroid gland-no further follow-up indicated. IMPRESSION: 1. No acute intracranial abnormality. 2. No acute displaced fracture or traumatic listhesis of the cervical spine. Electronically Signed   By: Tish Frederickson M.D.   On: 06/17/2023 20:46   CT CERVICAL SPINE WO CONTRAST Result Date: 06/17/2023 CLINICAL DATA:  Head trauma, moderate-severe; Polytrauma, blunt EXAM: CT HEAD WITHOUT CONTRAST CT CERVICAL SPINE WITHOUT CONTRAST TECHNIQUE: Multidetector CT imaging of the head and cervical spine was performed following the standard protocol without intravenous contrast. Multiplanar CT image reconstructions of the cervical spine were also generated. RADIATION DOSE REDUCTION: This exam was performed according to the departmental dose-optimization program which includes automated exposure control, adjustment of the mA and/or kV according to patient size and/or use of iterative reconstruction technique. COMPARISON:  None Available. FINDINGS: CT HEAD FINDINGS Brain: No evidence of large-territorial acute infarction. No parenchymal hemorrhage. No mass lesion. No extra-axial collection. No mass effect or midline shift. No hydrocephalus. Basilar cisterns  are patent. Vascular: No hyperdense vessel. Atherosclerotic calcifications are present within the cavernous internal carotid and vertebral arteries. Skull: No acute fracture or focal lesion. Sinuses/Orbits: Paranasal sinuses and mastoid air cells are clear. Bilateral lens replacement. Otherwise the orbits are unremarkable. Other: None. CT CERVICAL SPINE FINDINGS Alignment: Normal. Skull base and vertebrae: Multilevel disc bulge. Ligamentum flavum calcification. Multilevel mild-to-moderate degenerative changes of the spine. No associated severe osseous neural foraminal or central canal stenosis. No acute fracture. No aggressive appearing focal osseous lesion or focal pathologic process. Soft tissues and spinal canal: No prevertebral fluid or swelling. No visible canal hematoma. Upper chest: Unremarkable. Other: Slightly heterogeneous thyroid gland-no further follow-up indicated. IMPRESSION: 1. No acute intracranial abnormality. 2. No acute displaced fracture or traumatic listhesis of the cervical spine. Electronically Signed   By: Tish Frederickson M.D.   On: 06/17/2023 20:46    Procedures Procedures   Medications Ordered in ED Medications  ondansetron [facility] injection 4 mg (0 mg Intravenous Hold 06/17/23 1951)  iohexol (OMNIPAQUE) 350 MG/ML injection 50 mL (50 mLs Intravenous Contrast Given 06/17/23 2039)    ED Course/ Medical Decision Making/ A&P  Medical Decision Making Amount and/or Complexity of Data Reviewed Labs: ordered. Radiology: ordered.  Risk Prescription drug management.   On arrival, vital signs stable.  No noted signs of trauma.  Given that patient reported that she was  down for time before being able to call EMS, will order a CK.  Will additionally order labs to evaluate for anemia, infection, metabolic or electrolyte derangements, UTI, which could have caused her symptoms.  Will order CT head and cervical spine.  Patient had a recent left hip fracture and is endorsing pain in  that region as well as pain in her upper thoracic spinal region will order a CT CAP with thoracic and lumbar imaging for evaluation.  EKG was reassuring and I have a low suspicion for arrhythmia as a cause of patient's fall.  EKG shows a sinus rhythm with a rate of 66 bpm.  There are appropriate PR and QTc intervals.  QRS is slightly prolonged, however anticipate this is likely because of premature complexes present on EKG.  Tremor scans are reassuring with no evidence of acute fracture or dislocation.  Labs are also reassuring with no leukocytosis, stable hemoglobin, no electrolyte or metabolic derangements, appropriate glucose, renal and liver function WNL, a CK that is WNL, slightly elevated INR, expected with anticoagulation.  At the time of handoff, was awaiting urinalysis for confirmation that patient does not have a UTI that contributed to her loss of balance and fall.  Patient was handed off to incoming ED provider while awaiting urinalysis.  Anticipate patient will be safe for discharge home as patient has a safe home setting and is independent with no traumatic findings.  Final Clinical Impression(s) / ED Diagnoses Final diagnoses:  Fall, initial encounter    Rx / DC Orders ED Discharge Orders     None      Renella Cunas, PGY-2 Emergency Medicine   Renella Cunas, MD 06/18/23 1332    Lorre Nick, MD 06/20/23 1049

## 2023-06-17 NOTE — Telephone Encounter (Signed)
 Tresa Endo with [facility] states that they received an order for this patient, they are a [place] company not [employer] so they are not able to help.

## 2023-06-17 NOTE — ED Provider Notes (Signed)
 I saw and evaluated the patient, reviewed the resident's note and I agree with the findings and plan.  EKG Interpretation Date/Time:  Friday June 17 2023 19:33:54 EDT Ventricular Rate:  66 PR Interval:  182 QRS Duration:  108 QT Interval:  403 QTC Calculation: 423 R Axis:   2  Text Interpretation: Sinus rhythm Ventricular premature complex Low voltage, extremity and precordial leads Abnormal R-wave progression, early transition Confirmed by Lorre Nick ([id number]) on 06/17/2023 8:02:32 PM  EKG per interpretation shows sinus rhythm. 83 year old female here after mechanical fall just prior to arrival.  She is on Eliquis at this time.  No focal neurological deficits.  Will perform imaging and reassess.   Lorre Nick, MD 06/17/23 2003

## 2023-06-17 NOTE — Progress Notes (Signed)
 Orthopedic Tech Progress Note Patient Details:  Joanne Hayden 10-31-1940 [id number]  Patient ID: [name], female   DOB: 11-20-1940, 83 y.o.   MRN: [id number] Level II; not currently needed. Darleen Crocker 06/17/2023, 7:25 PM

## 2023-06-18 DIAGNOSIS — R531 Weakness: Secondary | ICD-10-CM | POA: Diagnosis not present

## 2023-06-18 DIAGNOSIS — Z743 Need for continuous supervision: Secondary | ICD-10-CM | POA: Diagnosis not present

## 2023-06-18 LAB — URINALYSIS, ROUTINE W REFLEX MICROSCOPIC
Bilirubin Urine: NEGATIVE
Glucose, UA: NEGATIVE mg/dL
Hgb urine dipstick: NEGATIVE
Ketones, ur: NEGATIVE mg/dL
Nitrite: NEGATIVE
Protein, ur: 30 mg/dL — AB
RBC / HPF: >50 RBC/hpf (ref 0–5)
Specific Gravity, Urine: >1.046 — ABNORMAL HIGH (ref 1.005–1.030)
pH: 6 (ref 5.0–8.0)

## 2023-06-18 NOTE — ED Notes (Signed)
 Patient unable to void at this time. Nurse was informed.

## 2023-06-18 NOTE — ED Provider Notes (Signed)
 Urinalysis    Component Value Date/Time   COLORURINE YELLOW 06/18/2023 0110   APPEARANCEUR HAZY (A) 06/18/2023 0110   LABSPEC >1.046 (H) 06/18/2023 0110   PHURINE 6.0 06/18/2023 0110   GLUCOSEU NEGATIVE 06/18/2023 0110   HGBUR NEGATIVE 06/18/2023 0110   BILIRUBINUR NEGATIVE 06/18/2023 0110   KETONESUR NEGATIVE 06/18/2023 0110   PROTEINUR 30 (A) 06/18/2023 0110   NITRITE NEGATIVE 06/18/2023 0110   LEUKOCYTESUR SMALL (A) 06/18/2023 0110      1:43 AM UA with small leuks but has 21-50 squamous cells.  I suspect this is contaminated.  Does not have any leukocytosis, fever, or other infectious symptoms here.  Sounds like she actually had a mechanical fall.  Will send for culture but will hold on antibiotics for now.  Can follow-up with PCP.  Return here for new concerns.   Garlon Hatchet, PA-C 06/18/23 0148    Coral Spikes, DO 06/18/23 [phone]

## 2023-06-18 NOTE — ED Notes (Signed)
Ptar called unable to give pick up time 

## 2023-06-18 NOTE — ED Notes (Signed)
 Spoke to pts son. Son is arranging transportation for the pt at this time

## 2023-06-18 NOTE — Discharge Instructions (Signed)
Please follow-up with your primary care doctor. Return here for new concerns. 

## 2023-06-20 LAB — URINE CULTURE

## 2023-06-20 NOTE — Telephone Encounter (Signed)
 Will try [phone] fax number per [employer] website.

## 2023-06-21 ENCOUNTER — Telehealth (HOSPITAL_BASED_OUTPATIENT_CLINIC_OR_DEPARTMENT_OTHER): Payer: Self-pay | Admitting: *Deleted

## 2023-06-21 NOTE — Telephone Encounter (Signed)
 Post ED Visit - Positive Culture Follow-up  Culture report reviewed by antimicrobial stewardship pharmacist: [facility] [x]  Ivery Quale Pharm.D. []  Celedonio Miyamoto, Pharm.D., BCPS AQ-ID []  Garvin Fila, Pharm.D., BCPS []  Georgina Pillion, Pharm.D., BCPS []  Malcolm, [address].D., BCPS, AAHIVP []  Estella Husk, Pharm.D., BCPS, AAHIVP []  Lysle Pearl, PharmD, BCPS []  Phillips Climes, PharmD, BCPS []  Agapito Games, PharmD, BCPS []  Verlan Friends, PharmD []  Mervyn Gay, PharmD, BCPS []  Vinnie Level, PharmD  [facility] []  Len Childs, PharmD []  Greer Pickerel, PharmD []  Adalberto Cole, PharmD []  Perlie Gold, Rph []  Lonell Face) Jean Rosenthal, PharmD []  Earl Many, PharmD []  Junita Push, PharmD []  Dorna Leitz, PharmD []  Terrilee Files, PharmD []  Lynann Beaver, PharmD []  Keturah Barre, PharmD []  Loralee Pacas, PharmD []  Bernadene Person, PharmD   Positive urine culture Treated with No antibiotics, no urinary symptoms.  No further patient follow-up is required at this time.  Bing Quarry 06/21/2023, 9:58 AM

## 2023-06-29 ENCOUNTER — Other Ambulatory Visit: Payer: Self-pay | Admitting: Cardiology

## 2023-07-01 DIAGNOSIS — Z79899 Other long term (current) drug therapy: Secondary | ICD-10-CM | POA: Diagnosis not present

## 2023-07-01 DIAGNOSIS — S0990XD Unspecified injury of head, subsequent encounter: Secondary | ICD-10-CM | POA: Diagnosis not present

## 2023-07-01 DIAGNOSIS — E43 Unspecified severe protein-calorie malnutrition: Secondary | ICD-10-CM | POA: Diagnosis not present

## 2023-07-01 DIAGNOSIS — R54 Age-related physical debility: Secondary | ICD-10-CM | POA: Diagnosis not present

## 2023-07-29 ENCOUNTER — Ambulatory Visit: Admitting: Podiatry

## 2024-02-10 NOTE — Progress Notes (Deleted)
 NEUROLOGY FOLLOW UP OFFICE NOTE  PRISILLA KOCSIS [id number]  Assessment/Plan:   Multiple sclerosis  She has both upper extremity and lower extremity weakness.  It is difficult for her to push herself.  Therefore, I think a motorized wheelchair is indicated.  Follow up one year  Total time spent in chart and face to face with patient:  ***   Subjective:  Aryona Sill is a 83 year old right-handed female with CHF, CAD s/p MI, diabetes mellitus, HTN and HLD who follows up for multiple sclerosis.  She is accompanied by her sister.   UPDATE: Current DMT:  None Current medications:  Eliquis , lisinopril , metoprolol , atorvastatin  40mg     Vision:  No problems  Uses reading glasses. Motor:  Generalized weakness.   Sensory/Pain:  Generalized back pain.  Left ankle aches.   Gait:  Uses manual wheelchair outside and in the house.  Her motorized wheelchair no longer works but she needs one because she cannot push herself.   Bowel/Bladder:  No issues Fatigue:  Chronic Cognition:  Memory okay Mood:  ok  HISTORY: She was diagnosed with multiple sclerosis in 1985.  Around 1980 she had numbness in right toe that traveled up to her right knee lasting 2-3 weeks. She had a recurrent episode about a year later but the numbness traveled up her thigh.  It occurred a third time and she was referred to neurology when she had an MRI of the brain that revealed an MS plaque on the right cerebral hemisphere.  She also had abnormal CSF analysis.  She was started on Avonex for 30 years until 2021.  She stopped because she had an MI in 2020.  While on Avonex, she continued to have episodes of numbness and tingling.  Gradually she developed increased weakness in the legs and difficulty ambulating.      Past DMT:  Avonex (took for 30 years, stopped April 2021)   She reports episodes of losing consciousness.  No preceding headache, lightheadedness/dizziness, diaphoresis, chest pain, shortness of breath, or  palpitations.   No warning.  Never witnessed.  She is unconscious for brief period.  The first time it occurred, it was after an Avonex injection.  She started having full body shaking and lowered herself to the floor.  She doesn't remember if she had passed out but she realized she had urinated but no tongue biting.  She had two subsequent episodes not preceded by shaking.  The last episode occurred after her Moderna COVID shot in April 2021.  When she woke up on the floor, she did not have incontinence or tongue biting.  EEG on 01/24/2020 was normal.  PAST MEDICAL HISTORY: Past Medical History:  Diagnosis Date   CHF (congestive heart failure) (HCC)    Diabetes mellitus without complication (HCC)    Heart attack (HCC)    Hyperlipidemia    Hypertension    Multiple sclerosis     MEDICATIONS: Current Outpatient Medications on File Prior to Visit  Medication Sig Dispense Refill   acetaminophen  (TYLENOL ) 325 MG tablet Take 325 mg by mouth every 6 (six) hours as needed for mild pain or headache.     atorvastatin  (LIPITOR) 40 MG tablet Take 40 mg by mouth at bedtime.     Continuous Blood Gluc Receiver (FREESTYLE LIBRE 14 DAY READER) DEVI See admin instructions. (Patient not taking: Reported on 02/16/2023)     Continuous Blood Gluc Sensor (FREESTYLE LIBRE 2 SENSOR) MISC Inject 1 Device into the skin every 14 (fourteen)  days. (Patient not taking: Reported on 02/16/2023)     dorzolamide -timolol  (COSOPT ) 22.3-6.8 MG/ML ophthalmic solution Place 1 drop into both eyes in the morning and at bedtime.     ELIQUIS  2.5 MG TABS tablet Take 2.5 mg by mouth 2 (two) times daily.     latanoprost  (XALATAN ) 0.005 % ophthalmic solution Place 1 drop into both eyes at bedtime.     lisinopril  (ZESTRIL ) 10 MG tablet 1 tablet     metoprolol  tartrate (LOPRESSOR ) 25 MG tablet Take 1 tablet (25 mg total) by mouth 2 (two) times daily. 60 tablet 2   Netarsudil Dimesylate (RHOPRESSA) 0.02 % SOLN      nitroGLYCERIN  (NITROSTAT )  0.4 MG SL tablet Place 1 tablet (0.4 mg total) under the tongue every 5 (five) minutes as needed for chest pain. 12 tablet 0   No current facility-administered medications on file prior to visit.    ALLERGIES: Allergies  Allergen Reactions   Codeine Nausea Only and Other (See Comments)    Severe nausea   Gabapentin  Other (See Comments)    Drowsiness   Naloxone Other (See Comments)    Patient disputes this in 2022   Nitrofurantoin Nausea Only and Other (See Comments)    Severe nausea and made the patient feel odd   Norco [Hydrocodone-Acetaminophen ] Other (See Comments)    Made the patient feel odd   Sulfa Antibiotics Other (See Comments)    Reaction not exactly recalled, but it DID cause an issue    FAMILY HISTORY: Family History  Problem Relation Age of Onset   Hypertension Sister       Objective:  *** General: No acute distress.  Patient appears well-groomed.   Head:  Normocephalic/atraumatic Eyes:  Fundi examined but not visualized Neck: supple, no paraspinal tenderness, full range of motion Heart:  Regular rate and rhythm Neurological Exam: Alert and oriented.  Speech fluent and not dysarthric.  Language intact.  CN II-XII intact.  ***.  Muscle strength 4+/5 bilateral upper extremities except 3+/5 grip bilaterally, 2/5 bilateral hip flexion, 3+/5 right knee extension/flexion, right foot drop, and otherwise 5/5.  Deep tendon reflexes 3+ throughout.  Finger to nose testing intact.  Nonambulatory.  Juliene Dunnings, DO  CC: Valery Ripple, MD

## 2024-02-16 ENCOUNTER — Ambulatory Visit: Payer: Medicare PPO | Admitting: Neurology

## 2024-02-23 ENCOUNTER — Ambulatory Visit: Admitting: Podiatry

## 2024-02-29 ENCOUNTER — Ambulatory Visit: Admitting: Podiatry

## 2024-02-29 ENCOUNTER — Encounter: Payer: Self-pay | Admitting: Podiatry

## 2024-02-29 DIAGNOSIS — E1142 Type 2 diabetes mellitus with diabetic polyneuropathy: Secondary | ICD-10-CM

## 2024-02-29 DIAGNOSIS — B351 Tinea unguium: Secondary | ICD-10-CM

## 2024-02-29 DIAGNOSIS — M79674 Pain in right toe(s): Secondary | ICD-10-CM | POA: Diagnosis not present

## 2024-02-29 DIAGNOSIS — M79675 Pain in left toe(s): Secondary | ICD-10-CM | POA: Diagnosis not present

## 2024-02-29 NOTE — Progress Notes (Signed)
 This patient returns to my office for at risk foot care.  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 This patient is unable to cut nails herself since the patient cannot reach her nails.These nails are painful walking and wearing shoes. Patient has not been seen in close to 2 years ago. This patient presents for at risk foot care today.  General Appearance  Alert, conversant and in no acute stress.  Vascular  Dorsalis pedis and posterior tibial  pulses are palpable  bilaterally.  Capillary return is within normal limits  bilaterally. Temperature is within normal limits  bilaterally.  Neurologic  Senn-Weinstein monofilament wire test within normal limits  bilaterally. Muscle power within normal limits bilaterally.  Nails Thick disfigured discolored nails with subungual debris  from hallux to fifth toes bilaterally. No evidence of bacterial infection or drainage bilaterally.  Orthopedic  No limitations of motion  feet .  No crepitus or effusions noted.  No bony pathology or digital deformities noted.  Skin  normotropic skin with no porokeratosis noted bilaterally.  No signs of infections or ulcers noted.     Onychomycosis  Pain in right toes  Pain in left toes  Consent was obtained for treatment procedures.   Mechanical debridement of nails 1-5  bilaterally performed with a nail nipper.  Filed with dremel without incident.    Return office visit                     Told patient to return for periodic foot care and evaluation due to potential at risk complications.   Cordella Bold DPM

## 2024-04-13 ENCOUNTER — Other Ambulatory Visit: Payer: Self-pay

## 2024-04-13 MED ORDER — ELIQUIS 2.5 MG PO TABS: 2.5000 mg | ORAL_TABLET | Freq: Two times a day (BID) | ORAL | 5 refills | Status: AC

## 2024-05-31 ENCOUNTER — Ambulatory Visit: Admitting: Podiatry
